# Patient Record
Sex: Female | Born: 1970 | Race: Black or African American | Hispanic: No | Marital: Married | Smoking: Current every day smoker
Health system: Southern US, Community
[De-identification: ages and names within clinical notes are randomized; demographics above are authoritative.]

## PROBLEM LIST (undated history)

## (undated) DIAGNOSIS — D869 Sarcoidosis, unspecified: Secondary | ICD-10-CM

## (undated) DIAGNOSIS — G43909 Migraine, unspecified, not intractable, without status migrainosus: Secondary | ICD-10-CM

## (undated) DIAGNOSIS — E079 Disorder of thyroid, unspecified: Secondary | ICD-10-CM

## (undated) DIAGNOSIS — I10 Essential (primary) hypertension: Secondary | ICD-10-CM

## (undated) HISTORY — DX: Essential (primary) hypertension: I10

## (undated) HISTORY — PX: LUNG BIOPSY: SHX232

## (undated) HISTORY — PX: TUBAL LIGATION: SHX77

---

## 2014-07-02 ENCOUNTER — Emergency Department (HOSPITAL_COMMUNITY)
Admission: EM | Admit: 2014-07-02 | Discharge: 2014-07-03 | Disposition: A | Discharge status: Home / Self Care | Payer: BC Managed Care – PPO | Attending: Emergency Medicine | Admitting: Emergency Medicine

## 2014-07-02 DIAGNOSIS — S0990XA Unspecified injury of head, initial encounter: Secondary | ICD-10-CM | POA: Insufficient documentation

## 2014-07-02 DIAGNOSIS — Y92410 Unspecified street and highway as the place of occurrence of the external cause: Secondary | ICD-10-CM | POA: Insufficient documentation

## 2014-07-02 DIAGNOSIS — S161XXA Strain of muscle, fascia and tendon at neck level, initial encounter: Secondary | ICD-10-CM

## 2014-07-02 DIAGNOSIS — Y9389 Activity, other specified: Secondary | ICD-10-CM | POA: Insufficient documentation

## 2014-07-02 DIAGNOSIS — S199XXA Unspecified injury of neck, initial encounter: Secondary | ICD-10-CM | POA: Diagnosis present

## 2014-07-02 DIAGNOSIS — M25571 Pain in right ankle and joints of right foot: Secondary | ICD-10-CM

## 2014-07-02 DIAGNOSIS — E079 Disorder of thyroid, unspecified: Secondary | ICD-10-CM | POA: Diagnosis not present

## 2014-07-02 DIAGNOSIS — S169XXA Unspecified injury of muscle, fascia and tendon at neck level, initial encounter: Secondary | ICD-10-CM | POA: Insufficient documentation

## 2014-07-02 DIAGNOSIS — Z79899 Other long term (current) drug therapy: Secondary | ICD-10-CM | POA: Insufficient documentation

## 2014-07-02 DIAGNOSIS — S99911A Unspecified injury of right ankle, initial encounter: Secondary | ICD-10-CM | POA: Diagnosis not present

## 2014-07-02 HISTORY — DX: Disorder of thyroid, unspecified: E07.9

## 2014-07-03 ENCOUNTER — Encounter (HOSPITAL_COMMUNITY): Payer: Self-pay | Admitting: Emergency Medicine

## 2014-07-03 ENCOUNTER — Emergency Department (HOSPITAL_COMMUNITY): Payer: BC Managed Care – PPO

## 2014-07-03 MED ORDER — HYDROCODONE-ACETAMINOPHEN 5-325 MG PO TABS: 1.0000 | ORAL_TABLET | Freq: Four times a day (QID) | ORAL | Status: DC | PRN

## 2014-07-03 MED ORDER — KETOROLAC TROMETHAMINE 30 MG/ML IJ SOLN
30.0000 mg | Freq: Once | INTRAMUSCULAR | Status: AC
  Administered 2014-07-03: 30 mg via INTRAMUSCULAR
  Filled 2014-07-03: qty 1

## 2014-07-03 MED ORDER — IBUPROFEN 600 MG PO TABS: 600.0000 mg | ORAL_TABLET | Freq: Four times a day (QID) | ORAL | Status: DC | PRN

## 2014-07-03 MED ORDER — OXYCODONE-ACETAMINOPHEN 5-325 MG PO TABS
1.0000 | ORAL_TABLET | Freq: Once | ORAL | Status: AC
  Administered 2014-07-03: 1 via ORAL
  Filled 2014-07-03: qty 1
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
## 2017-04-27 ENCOUNTER — Other Ambulatory Visit: Payer: Self-pay | Admitting: *Deleted

## 2017-04-27 DIAGNOSIS — N611 Abscess of the breast and nipple: Secondary | ICD-10-CM

## 2017-06-06 ENCOUNTER — Other Ambulatory Visit: Payer: Self-pay | Admitting: *Deleted

## 2017-06-06 DIAGNOSIS — N6310 Unspecified lump in the right breast, unspecified quadrant: Secondary | ICD-10-CM

## 2017-09-05 ENCOUNTER — Ambulatory Visit (HOSPITAL_COMMUNITY)
Admission: EM | Admit: 2017-09-05 | Discharge: 2017-09-05 | Disposition: A | Discharge status: Home / Self Care | Payer: BLUE CROSS/BLUE SHIELD | Attending: Family Medicine | Admitting: Family Medicine

## 2017-09-05 ENCOUNTER — Other Ambulatory Visit: Payer: Self-pay

## 2017-09-05 ENCOUNTER — Encounter (HOSPITAL_COMMUNITY): Payer: Self-pay | Admitting: Emergency Medicine

## 2017-09-05 DIAGNOSIS — L509 Urticaria, unspecified: Secondary | ICD-10-CM

## 2017-09-05 HISTORY — DX: Sarcoidosis, unspecified: D86.9

## 2017-09-05 MED ORDER — PREDNISONE 10 MG (21) PO TBPK: ORAL_TABLET | Freq: Every day | ORAL | 0 refills | Status: DC

## 2017-09-05 NOTE — ED Triage Notes (Signed)
Noticed rash on both legs and little fingers yesterday.  Reports rash itches.  Denies anyone else at home with rash

## 2017-09-06 NOTE — ED Provider Notes (Signed)
  [facility]   [id number] 09/05/17 Arrival Time: 1348  ASSESSMENT & PLAN:  1. Hives     Meds ordered this encounter  Medications  . predniSONE (STERAPRED UNI-PAK 21 TAB) 10 MG (21) TBPK tablet    Sig: Take by mouth daily. Take as directed.    Dispense:  21 tablet    Refill:  0   May use OTC Benadryl as well. Will f/u if not seeing improvement over the next 24-48 hours.  Reviewed expectations re: course of current medical issues. Questions answered. Outlined signs and symptoms indicating need for more acute intervention. Patient verbalized understanding. After Visit Summary given.   SUBJECTIVE:  Kristine Baldwin is a 47 y.o. female who presents with complaint of a "red rash" on her legs first noticed yesterday. Abrupt onset. Intense itching. OTC cream without relief. No h/o similar rash. No significant change today. Afebrile. No new exposures. No associated pain, nausea, vomiting, or breathing problems.  ROS: As per HPI.  OBJECTIVE: Vitals:   09/05/17 1435  BP: (!) 141/97  Pulse: 78  Resp: 18  Temp: 98.4 F (36.9 C)  TempSrc: Oral  SpO2: 100%    General appearance: alert; no distress Lungs: clear to auscultation bilaterally Heart: regular rate and rhythm Extremities: no edema Skin: warm and dry; wheals over lower legs Psychological: alert and cooperative; normal mood and affect   Allergies  Allergen Reactions  . Prednisone Other (See Comments)    Legs hurt    Past Medical History:  Diagnosis Date  . Sarcoidosis   . Thyroid disease    Social History   Socioeconomic History  . Marital status: Married    Spouse name: Not on file  . Number of children: Not on file  . Years of education: Not on file  . Highest education level: Not on file  Social Needs  . Financial resource strain: Not on file  . Food insecurity - worry: Not on file  . Food insecurity - inability: Not on file  . Transportation needs - medical: Not on file  . Transportation  needs - non-medical: Not on file  Occupational History  . Not on file  Tobacco Use  . Smoking status: Never Smoker  . Smokeless tobacco: Never Used  Substance and Sexual Activity  . Alcohol use: Yes  . Drug use: Yes    Types: Marijuana  . Sexual activity: Not on file  Other Topics Concern  . Not on file  Social History Narrative  . Not on file   Family History  Problem Relation Age of Onset  . Diabetes Mother   . Hypertension Mother   . Diabetes Father   . Hypertension Father    History reviewed. No pertinent surgical history.   Mardella LaymanHagler, Salimatou Simone, MD 09/09/17 1056

## 2017-09-08 DIAGNOSIS — I10 Essential (primary) hypertension: Secondary | ICD-10-CM | POA: Insufficient documentation

## 2017-12-26 ENCOUNTER — Emergency Department (HOSPITAL_COMMUNITY)
Admission: EM | Admit: 2017-12-26 | Discharge: 2017-12-26 | Disposition: A | Discharge status: Home / Self Care | Payer: BLUE CROSS/BLUE SHIELD | Attending: Emergency Medicine | Admitting: Emergency Medicine

## 2017-12-26 ENCOUNTER — Emergency Department (HOSPITAL_COMMUNITY): Payer: BLUE CROSS/BLUE SHIELD

## 2017-12-26 ENCOUNTER — Encounter (HOSPITAL_COMMUNITY): Payer: Self-pay | Admitting: Emergency Medicine

## 2017-12-26 DIAGNOSIS — R072 Precordial pain: Secondary | ICD-10-CM | POA: Insufficient documentation

## 2017-12-26 DIAGNOSIS — R918 Other nonspecific abnormal finding of lung field: Secondary | ICD-10-CM | POA: Diagnosis not present

## 2017-12-26 DIAGNOSIS — Z87891 Personal history of nicotine dependence: Secondary | ICD-10-CM | POA: Insufficient documentation

## 2017-12-26 DIAGNOSIS — R0789 Other chest pain: Secondary | ICD-10-CM | POA: Diagnosis present

## 2017-12-26 LAB — BASIC METABOLIC PANEL
Anion gap: 9 (ref 5–15)
BUN: 7 mg/dL (ref 6–20)
CO2: 26 mmol/L (ref 22–32)
CREATININE: 0.89 mg/dL (ref 0.44–1.00)
Calcium: 9.3 mg/dL (ref 8.9–10.3)
Chloride: 101 mmol/L (ref 101–111)
GFR calc non Af Amer: >60 mL/min (ref >60)
Glucose, Bld: 172 mg/dL — ABNORMAL HIGH (ref 65–99)
Potassium: 3.3 mmol/L — ABNORMAL LOW (ref 3.5–5.1)
Sodium: 136 mmol/L (ref 135–145)

## 2017-12-26 LAB — CBC
HCT: 41.4 % (ref 36.0–46.0)
Hemoglobin: 13.9 g/dL (ref 12.0–15.0)
MCH: 30.8 pg (ref 26.0–34.0)
MCHC: 33.6 g/dL (ref 30.0–36.0)
MCV: 91.8 fL (ref 78.0–100.0)
PLATELETS: 220 10*3/uL (ref 150–400)
RBC: 4.51 MIL/uL (ref 3.87–5.11)
RDW: 13.2 % (ref 11.5–15.5)
WBC: 7.7 10*3/uL (ref 4.0–10.5)

## 2017-12-26 LAB — I-STAT TROPONIN, ED: TROPONIN I, POC: 0 ng/mL (ref 0.00–0.08)

## 2017-12-26 LAB — I-STAT BETA HCG BLOOD, ED (MC, WL, AP ONLY): I-stat hCG, quantitative: 12.2 m[IU]/mL — ABNORMAL HIGH (ref <5)

## 2017-12-26 LAB — D-DIMER, QUANTITATIVE: D-Dimer, Quant: 0.73 ug/mL-FEU — ABNORMAL HIGH (ref 0.00–0.50)

## 2017-12-26 MED ORDER — KETOROLAC TROMETHAMINE 30 MG/ML IJ SOLN
30.0000 mg | Freq: Once | INTRAMUSCULAR | Status: AC
  Administered 2017-12-26: 30 mg via INTRAVENOUS
  Filled 2017-12-26: qty 1

## 2017-12-26 MED ORDER — IBUPROFEN 800 MG PO TABS: 800.0000 mg | ORAL_TABLET | Freq: Three times a day (TID) | ORAL | 0 refills | Status: DC | PRN

## 2017-12-26 MED ORDER — IOPAMIDOL (ISOVUE-370) INJECTION 76%
100.0000 mL | Freq: Once | INTRAVENOUS | Status: AC | PRN
  Administered 2017-12-26: 100 mL via INTRAVENOUS

## 2017-12-26 MED ORDER — IOPAMIDOL (ISOVUE-370) INJECTION 76%
INTRAVENOUS | Status: AC
  Filled 2017-12-26: qty 100

## 2017-12-26 NOTE — ED Provider Notes (Signed)
Emergency Department Provider Note   I have reviewed the triage vital signs and the nursing notes.   HISTORY  Chief Complaint Chest Pain   HPI Kristine Baldwin is a 47 y.o. female with PMH of sarcoidosis and tobacco use history presents to the emergency department for evaluation of left-sided chest pain for the past 4 days.  Her symptoms have been constant with intermittent worsening symptoms.  She states her pain is worse when she is using her left arm such as when she closes her car door.  Position makes no difference in symptoms.  No exertional or pleuritic component to pain.  She denies any shortness of breath.  No similar pain in the past.  She has had a tubal ligation and does not think it is possible that she is pregnant.  No productive cough or hemoptysis.  No fevers or chills. She has taken OTC medication for pain but none today.    Past Medical History:  Diagnosis Date  . Sarcoidosis   . Thyroid disease     There are no active problems to display for this patient.   History reviewed. No pertinent surgical history.  Current Outpatient Rx  . Order #: [id number] Class: Historical Med  . Order #: [id number] Class: Historical Med  . Order #: [id number] Class: Print  . Order #: [id number] Class: Normal    Allergies Prednisone  Family History  Problem Relation Age of Onset  . Diabetes Mother   . Hypertension Mother   . Diabetes Father   . Hypertension Father     Social History Social History   Tobacco Use  . Smoking status: Never Smoker  . Smokeless tobacco: Never Used  Substance Use Topics  . Alcohol use: Yes  . Drug use: Yes    Types: Marijuana    Review of Systems  Constitutional: No fever/chills Eyes: No visual changes. ENT: No sore throat. Cardiovascular: Positive chest pain. Respiratory: Denies shortness of breath. Gastrointestinal: No abdominal pain.  No nausea, no vomiting.  No diarrhea.  No constipation. Genitourinary: Negative for  dysuria. Musculoskeletal: Negative for back pain. Skin: Negative for rash. Neurological: Negative for headaches, focal weakness or numbness.  10-point ROS otherwise negative.  ____________________________________________   PHYSICAL EXAM:  VITAL SIGNS: ED Triage Vitals  Enc Vitals Group     BP 12/26/17 1215 (!) 142/95     Pulse Rate 12/26/17 1215 83     Resp 12/26/17 1215 16     Temp 12/26/17 1215 98.3 F (36.8 C)     Temp Source 12/26/17 1215 Oral     SpO2 12/26/17 1215 100 %     Weight 12/26/17 1215 94 lb (42.6 kg)     Height 12/26/17 1215 5\' 7"  (1.702 m)     Pain Score 12/26/17 1218 9   Constitutional: Alert and oriented. Well appearing and in no acute distress. Eyes: Conjunctivae are normal. Head: Atraumatic. Nose: No congestion/rhinnorhea. Mouth/Throat: Mucous membranes are moist.  Oropharynx non-erythematous. Neck: No stridor.   Cardiovascular: Normal rate, regular rhythm. Good peripheral circulation. Grossly normal heart sounds.   Respiratory: Normal respiratory effort.  No retractions. Lungs CTAB. Gastrointestinal: Soft and nontender. No distention.  Musculoskeletal: No lower extremity tenderness nor edema. No gross deformities of extremities. Patient winces and pulls away with palpation of the left chest wall. No crepitus or deformity.  Neurologic:  Normal speech and language. No gross focal neurologic deficits are appreciated.  Skin:  Skin is warm, dry and intact. No rash noted.  ____________________________________________  LABS (all labs ordered are listed, but only abnormal results are displayed)  Labs Reviewed  BASIC METABOLIC PANEL - Abnormal; Notable for the following components:      Result Value   Potassium 3.3 (*)    Glucose, Bld 172 (*)    All other components within normal limits  D-DIMER, QUANTITATIVE (NOT AT [facility]) - Abnormal; Notable for the following components:   D-Dimer, Quant 0.73 (*)    All other components within normal limits  I-STAT  BETA HCG BLOOD, ED ([facility], [facility], [facility] ONLY) - Abnormal; Notable for the following components:   I-stat hCG, quantitative 12.2 (*)    All other components within normal limits  CBC  I-STAT TROPONIN, ED   ____________________________________________  EKG   EKG Interpretation  Date/Time:  Tuesday December 26 2017 12:16:38 EDT Ventricular Rate:  85 PR Interval:  128 QRS Duration: 90 QT Interval:  392 QTC Calculation: 466 R Axis:   70 Text Interpretation:  Normal sinus rhythm Left ventricular hypertrophy Abnormal ECG No STEMI.  Confirmed by Alona Bene [phone]) on 12/26/2017 3:01:48 PM       ____________________________________________  RADIOLOGY  Dg Chest 2 View  Result Date: 12/26/2017 CLINICAL DATA:  Four day history of sharp chest pain, found have abscess normal EKG. History of sarcoidosis and thyroid disease. EXAM: CHEST - 2 VIEW COMPARISON:  None in PACs FINDINGS: The lungs are hyperinflated. There is hemidiaphragm flattening. There is no pneumothorax, pneumomediastinum, or pleural effusion. There are postsurgical changes in the lower left lung. The heart and pulmonary vascularity are normal. The bony thorax is unremarkable. The mediastinum is normal in width. IMPRESSION: Hyperinflation consistent with COPD or reactive airway disease. Postsurgical change at the left lung base. No acute cardiopulmonary abnormality. Electronically Signed   By: David  [place] M.D.   On: 12/26/2017 13:13   Ct Angio Chest Pe W And/or Wo Contrast  Result Date: 12/26/2017 CLINICAL DATA:  47 y/o F; 4 days of sharp piercing chest pain. History of sarcoidosis. EXAM: CT ANGIOGRAPHY CHEST WITH CONTRAST TECHNIQUE: Multidetector CT imaging of the chest was performed using the standard protocol during bolus administration of intravenous contrast. Multiplanar CT image reconstructions and MIPs were obtained to evaluate the vascular anatomy. CONTRAST:  ISOVUE-370 IOPAMIDOL (ISOVUE-370) INJECTION 76% COMPARISON:  None.  FINDINGS: Cardiovascular: Satisfactory opacification of the pulmonary arteries to the segmental level. No evidence of pulmonary embolism. Normal heart size. No pericardial effusion. Mediastinum/Nodes: No enlarged mediastinal, hilar, or axillary lymph nodes. Thyroid gland, trachea, and esophagus demonstrate no significant findings. Lungs/Pleura: In the right upper lobe there is a 7 x 7 mm nodule (7 mm mean diameter) on image 28 of series 7. In the right upper lobe there is a 5 x 9 mm nodule (7 mm mean diameter) on image 55 of series 7. No consolidation, effusion, pneumothorax. Surgical scarring at the left lung base. Upper Abdomen: 14 mm left lobe of liver cyst. Musculoskeletal: No chest wall abnormality. No acute or significant osseous findings. Review of the MIP images confirms the above findings. IMPRESSION: 1. No pulmonary embolus or acute pulmonary process. 2. 7 mm mean diameter pulmonary nodules in the right upper lobe probably related to history of sarcoidosis. Non-contrast chest CT at 3-6 months is recommended. If the nodules are stable at time of repeat CT, then future CT at 18-24 months (from today's scan) is considered optional for low-risk patients, but is recommended for high-risk patients. This recommendation follows the consensus statement: Guidelines for Management of Incidental Pulmonary Nodules  Detected on CT Images: From the Fleischner Society 2017; Radiology 2017; [id number]:[id number]. Electronically Signed   By: Mitzi HansenLance  Furusawa-Stratton M.D.   On: 12/26/2017 20:23    ____________________________________________   PROCEDURES  Procedure(s) performed:   Procedures  None ____________________________________________   INITIAL IMPRESSION / ASSESSMENT AND PLAN / ED COURSE  Pertinent labs & imaging results that were available during my care of the patient were reviewed by me and considered in my medical decision making (see chart for details).  Patient presents to the emergency department for  evaluation of chest pain.  The patient's chest pain seems to be mostly musculoskeletal in etiology.  She has had mostly constant symptoms for the past 4 days.  EKG and troponin were reviewed and negative.  She was initially evaluated at urgent care and then referred to the emergency department.  Lower suspicion for pulmonary embolism but some of her pain symptoms are slightly concerning such as the sharp quality of intermittent pain with baseline soreness.  Patient is otherwise low risk for PE.  Plan for d-dimer for further risk stratification.  No need for troponin trending with 4 days of constant pain and nonspecific EKG.   Differential includes all life-threatening causes for chest pain. This includes but is not exclusive to acute coronary syndrome, aortic dissection, pulmonary embolism, cardiac tamponade, community-acquired pneumonia, pericarditis, musculoskeletal chest wall pain, etc.  06:20 PM Patient with elevated D-dimer. Discussed results with the patient and will move ahead with CTA to r/o PE given CP. Patient feeling better with Toradol.   CTA negative for PE. Did discuss the pulmonary nodules and need for repeat CT chest in 3-6 months. CP improved with Toradol.   At this time, I do not feel there is any life-threatening condition present. I have reviewed and discussed all results (EKG, imaging, lab, urine as appropriate), exam findings with patient. I have reviewed nursing notes and appropriate previous records.  I feel the patient is safe to be discharged home without further emergent workup. Discussed usual and customary return precautions. Patient and family (if present) verbalize understanding and are comfortable with this plan.  Patient will follow-up with their primary care provider. If they do not have a primary care provider, information for follow-up has been provided to them. All questions have been answered.  ____________________________________________  FINAL CLINICAL  IMPRESSION(S) / ED DIAGNOSES  Final diagnoses:  Precordial chest pain  Pulmonary nodules     MEDICATIONS GIVEN DURING THIS VISIT:  Medications  iopamidol (ISOVUE-370) 76 % injection (has no administration in time range)  ketorolac (TORADOL) 30 MG/ML injection 30 mg (30 mg Intravenous Given 12/26/17 1639)  iopamidol (ISOVUE-370) 76 % injection 100 mL (100 mLs Intravenous Contrast Given 12/26/17 1952)     NEW OUTPATIENT MEDICATIONS STARTED DURING THIS VISIT:  Discharge Medication List as of 12/26/2017  8:45 PM    START taking these medications   Details  ibuprofen (ADVIL,MOTRIN) 800 MG tablet Take 1 tablet (800 mg total) by mouth every 8 (eight) hours as needed., Starting Tue 12/26/2017, Print        Note:  This document was prepared using Dragon voice recognition software and may include unintentional dictation errors.  Alona BeneJoshua Shaquil Aldana, MD Emergency Medicine    Carlas Vandyne, Arlyss RepressJoshua G, MD 12/26/17 [phone]

## 2017-12-26 NOTE — ED Notes (Signed)
Patient transported to CT 

## 2017-12-26 NOTE — Discharge Instructions (Signed)
You have been seen in the Emergency Department (ED) today for chest pain.  As we have discussed today?s test results are normal, but you may require further testing.  You do have some lung nodules that were found on CT. The radiologist recommends follow up CT scan of the chest in 3-6 months. Talk with your PCP about scheduling this testing.   Please follow up with the recommended doctor as instructed above in these documents regarding today?s emergent visit and your recent symptoms to discuss further management.  Continue to take your regular medications.   Return to the Emergency Department (ED) if you experience any further chest pain/pressure/tightness, difficulty breathing, or sudden sweating, or other symptoms that concern you.   Chest Pain (Nonspecific) It is often hard to give a specific diagnosis for the cause of chest pain. There is always a chance that your pain could be related to something serious, such as a heart attack or a blood clot in the lungs. You need to follow up with your health care provider for further evaluation. CAUSES  Heartburn. Pneumonia or bronchitis. Anxiety or stress. Inflammation around your heart (pericarditis) or lung (pleuritis or pleurisy). A blood clot in the lung. A collapsed lung (pneumothorax). It can develop suddenly on its own (spontaneous pneumothorax) or from trauma to the chest. Shingles infection (herpes zoster virus). The chest wall is composed of bones, muscles, and cartilage. Any of these can be the source of the pain. The bones can be bruised by injury. The muscles or cartilage can be strained by coughing or overwork. The cartilage can be affected by inflammation and become sore (costochondritis). DIAGNOSIS  Lab tests or other studies may be needed to find the cause of your pain. Your health care provider may have you take a test called an ambulatory electrocardiogram (ECG). An ECG records your heartbeat patterns over a 24-hour period. You  may also have other tests, such as: Transthoracic echocardiogram (TTE). During echocardiography, sound waves are used to evaluate how blood flows through your heart. Transesophageal echocardiogram (TEE). Cardiac monitoring. This allows your health care provider to monitor your heart rate and rhythm in real time. Holter monitor. This is a portable device that records your heartbeat and can help diagnose heart arrhythmias. It allows your health care provider to track your heart activity for several days, if needed. Stress tests by exercise or by giving medicine that makes the heart beat faster. TREATMENT  Treatment depends on what may be causing your chest pain. Treatment may include: Acid blockers for heartburn. Anti-inflammatory medicine. Pain medicine for inflammatory conditions. Antibiotics if an infection is present. You may be advised to change lifestyle habits. This includes stopping smoking and avoiding alcohol, caffeine, and chocolate. You may be advised to keep your head raised (elevated) when sleeping. This reduces the chance of acid going backward from your stomach into your esophagus. Most of the time, nonspecific chest pain will improve within 2-3 days with rest and mild pain medicine.  HOME CARE INSTRUCTIONS  If antibiotics were prescribed, take them as directed. Finish them even if you start to feel better. For the next few days, avoid physical activities that bring on chest pain. Continue physical activities as directed. Do not use any tobacco products, including cigarettes, chewing tobacco, or electronic cigarettes. Avoid drinking alcohol. Only take medicine as directed by your health care provider. Follow your health care provider's suggestions for further testing if your chest pain does not go away. Keep any follow-up appointments you made. If you  do not go to an appointment, you could develop lasting (chronic) problems with pain. If there is any problem keeping an  appointment, call to reschedule. SEEK MEDICAL CARE IF:  Your chest pain does not go away, even after treatment. You have a rash with blisters on your chest. You have a fever. SEEK IMMEDIATE MEDICAL CARE IF:  You have increased chest pain or pain that spreads to your arm, neck, jaw, back, or abdomen. You have shortness of breath. You have an increasing cough, or you cough up blood. You have severe back or abdominal pain. You feel nauseous or vomit. You have severe weakness. You faint. You have chills. This is an emergency. Do not wait to see if the pain will go away. Get medical help at once. Call your local emergency services (911 in [place] Do not drive yourself to the hospital. MAKE SURE YOU:  Understand these instructions. Will watch your condition. Will get help right away if you are not doing well or get worse. Document Released: 06/01/2005 Document Revised: 08/27/2013 Document Reviewed: 03/27/2008 [facility] Patient Information 2015 [place]. This information is not intended to replace advice given to you by your health care provider. Make sure you discuss any questions you have with your health care provider.

## 2017-12-26 NOTE — ED Triage Notes (Signed)
Pt to ER for evaluation of 4 days sharp "piercing" chest pain. [facility] Pt in NAD. Denies all other symptoms.

## 2018-09-24 ENCOUNTER — Other Ambulatory Visit: Payer: Self-pay

## 2018-09-24 ENCOUNTER — Emergency Department (HOSPITAL_COMMUNITY): Payer: BLUE CROSS/BLUE SHIELD

## 2018-09-24 ENCOUNTER — Emergency Department (HOSPITAL_COMMUNITY)
Admission: EM | Admit: 2018-09-24 | Discharge: 2018-09-25 | Disposition: A | Discharge status: Home / Self Care | Payer: BLUE CROSS/BLUE SHIELD | Attending: Emergency Medicine | Admitting: Emergency Medicine

## 2018-09-24 DIAGNOSIS — N3000 Acute cystitis without hematuria: Secondary | ICD-10-CM | POA: Insufficient documentation

## 2018-09-24 DIAGNOSIS — R1031 Right lower quadrant pain: Secondary | ICD-10-CM | POA: Diagnosis present

## 2018-09-24 DIAGNOSIS — R1030 Lower abdominal pain, unspecified: Secondary | ICD-10-CM

## 2018-09-24 DIAGNOSIS — N3090 Cystitis, unspecified without hematuria: Secondary | ICD-10-CM

## 2018-09-24 LAB — COMPREHENSIVE METABOLIC PANEL
ALT: 13 U/L (ref 0–44)
ANION GAP: 13 (ref 5–15)
AST: 27 U/L (ref 15–41)
Albumin: 4.5 g/dL (ref 3.5–5.0)
Alkaline Phosphatase: 70 U/L (ref 38–126)
BUN: 10 mg/dL (ref 6–20)
CO2: 24 mmol/L (ref 22–32)
Calcium: 9.5 mg/dL (ref 8.9–10.3)
Chloride: 99 mmol/L (ref 98–111)
Creatinine, Ser: 0.94 mg/dL (ref 0.44–1.00)
GFR calc Af Amer: >60 mL/min (ref >60)
GFR calc non Af Amer: >60 mL/min (ref >60)
Glucose, Bld: 153 mg/dL — ABNORMAL HIGH (ref 70–99)
Potassium: 3.1 mmol/L — ABNORMAL LOW (ref 3.5–5.1)
SODIUM: 136 mmol/L (ref 135–145)
Total Bilirubin: 1.2 mg/dL (ref 0.3–1.2)
Total Protein: 7.7 g/dL (ref 6.5–8.1)

## 2018-09-24 LAB — CBC
HCT: 39.5 % (ref 36.0–46.0)
HEMOGLOBIN: 13.1 g/dL (ref 12.0–15.0)
MCH: 31.8 pg (ref 26.0–34.0)
MCHC: 33.2 g/dL (ref 30.0–36.0)
MCV: 95.9 fL (ref 80.0–100.0)
Platelets: 224 10*3/uL (ref 150–400)
RBC: 4.12 MIL/uL (ref 3.87–5.11)
RDW: 13.9 % (ref 11.5–15.5)
WBC: 8 10*3/uL (ref 4.0–10.5)
nRBC: 0 % (ref 0.0–0.2)

## 2018-09-24 LAB — URINALYSIS, ROUTINE W REFLEX MICROSCOPIC
Glucose, UA: NEGATIVE mg/dL
Hgb urine dipstick: NEGATIVE
Ketones, ur: 20 mg/dL — AB
Nitrite: NEGATIVE
Protein, ur: 100 mg/dL — AB
Specific Gravity, Urine: 1.028 (ref 1.005–1.030)
pH: 5 (ref 5.0–8.0)

## 2018-09-24 LAB — POC URINE PREG, ED: Preg Test, Ur: NEGATIVE

## 2018-09-24 LAB — LIPASE, BLOOD: LIPASE: 26 U/L (ref 11–51)

## 2018-09-24 MED ORDER — IOHEXOL 300 MG/ML  SOLN
100.0000 mL | Freq: Once | INTRAMUSCULAR | Status: AC | PRN
  Administered 2018-09-24: 100 mL via INTRAVENOUS

## 2018-09-24 NOTE — ED Notes (Signed)
ED Provider at bedside. 

## 2018-09-24 NOTE — ED Notes (Signed)
Patient transported to CT 

## 2018-09-24 NOTE — ED Provider Notes (Signed)
[facility] EMERGENCY DEPARTMENT Provider Note   CSN: [id number] Arrival date & time: 09/24/18  1943   History   Chief Complaint Chief Complaint  Patient presents with  . Abdominal Pain    HPI Kristine Baldwin is a 48 y.o. female.  HPI   Kristine Baldwin is a 48 y.o. female with PMH of sarcoidosis and thyroid disease who presents with right lower quadrant pain/suprapubic pain for the last week that was intermittent and described as a crampy sensation but has been constant since around 11 AM this morning.  No fevers or chills but did have one episode of nonbloody and bilious emesis yesterday.  Able to eat and drink earlier today.  No recent falls or trauma.  Adamantly denies vaginal discharge or burning/itching.  No recent medication changes.  No dysuria or hematuria.  She does report the pain in her suprapubic space intermittently radiates toward her back on both sides.  Past Medical History:  Diagnosis Date  . Sarcoidosis   . Thyroid disease     There are no active problems to display for this patient.   No past surgical history on file.   OB History   No obstetric history on file.      Home Medications    Prior to Admission medications   Medication Sig Start Date End Date Taking? Authorizing Provider  ibuprofen (ADVIL,MOTRIN) 200 MG tablet Take 600 mg by mouth every 6 (six) hours as needed (for pain or headaches).   Yes [provider]  cephALEXin (KEFLEX) 500 MG capsule Take 1 capsule (500 mg total) by mouth 4 (four) times daily for 10 days. 09/25/18 10/05/18  Asal Teas, Sherryle Lis, MD  ibuprofen (ADVIL,MOTRIN) 800 MG tablet Take 1 tablet (800 mg total) by mouth every 8 (eight) hours as needed. Patient not taking: Reported on 09/24/2018 12/26/17   Long, Arlyss Repress, MD  predniSONE (STERAPRED UNI-PAK 21 TAB) 10 MG (21) TBPK tablet Take by mouth daily. Take as directed. Patient not taking: Reported on 09/24/2018 09/05/17   Mardella Layman, MD    Family  History Family History  Problem Relation Age of Onset  . Diabetes Mother   . Hypertension Mother   . Diabetes Father   . Hypertension Father     Social History Social History   Tobacco Use  . Smoking status: Never Smoker  . Smokeless tobacco: Never Used  Substance Use Topics  . Alcohol use: Yes  . Drug use: Yes    Types: Marijuana     Allergies   Patient has no known allergies.   Review of Systems Review of Systems  Constitutional: Negative for chills and fever.  HENT: Negative for ear pain and sore throat.   Eyes: Negative for pain and visual disturbance.  Respiratory: Negative for cough and shortness of breath.   Cardiovascular: Negative for chest pain and palpitations.  Gastrointestinal: Positive for abdominal pain, nausea and vomiting.  Genitourinary: Negative for dysuria and hematuria.  Musculoskeletal: Negative for arthralgias and back pain.  Skin: Negative for color change and rash.  Neurological: Negative for seizures and syncope.  All other systems reviewed and are negative.    Physical Exam Updated Vital Signs BP (!) 158/88 (BP Location: Right Arm)   Pulse 66   Temp 98.1 F (36.7 C) (Oral)   Resp 16   Ht 5\' 7"  (1.702 m)   Wt 45.8 kg   LMP 09/07/2017 (Approximate)   SpO2 100%   BMI 15.82 kg/m  Physical Exam Vitals signs and nursing note reviewed.  Constitutional:      General: She is not in acute distress.    Appearance: Normal appearance. She is well-developed. She is not diaphoretic.  HENT:     Head: Normocephalic and atraumatic.  Eyes:     Conjunctiva/sclera: Conjunctivae normal.  Neck:     Musculoskeletal: Neck supple.  Cardiovascular:     Rate and Rhythm: Normal rate and regular rhythm.     Heart sounds: No murmur.  Pulmonary:     Effort: Pulmonary effort is normal. No respiratory distress.     Breath sounds: Normal breath sounds.  Abdominal:     General: Abdomen is flat.     Palpations: Abdomen is soft.     Tenderness: There  is abdominal tenderness in the right lower quadrant and suprapubic area. There is no right CVA tenderness, left CVA tenderness or guarding. Positive signs include McBurney's sign and obturator sign. Negative signs include Murphy's sign, Rovsing's sign and psoas sign.     Hernia: No hernia is present.  Skin:    General: Skin is warm and dry.  Neurological:     General: No focal deficit present.     Mental Status: She is alert and oriented to person, place, and time.  Psychiatric:        Behavior: Behavior is cooperative.      ED Treatments / Results  Labs (all labs ordered are listed, but only abnormal results are displayed) Labs Reviewed  COMPREHENSIVE METABOLIC PANEL - Abnormal; Notable for the following components:      Result Value   Potassium 3.1 (*)    Glucose, Bld 153 (*)    All other components within normal limits  URINALYSIS, ROUTINE W REFLEX MICROSCOPIC - Abnormal; Notable for the following components:   Color, Urine AMBER (*)    APPearance HAZY (*)    Bilirubin Urine SMALL (*)    Ketones, ur 20 (*)    Protein, ur 100 (*)    Leukocytes, UA MODERATE (*)    Bacteria, UA MANY (*)    All other components within normal limits  URINE CULTURE  LIPASE, BLOOD  CBC  POC URINE PREG, ED    EKG None  Radiology Ct Abdomen Pelvis W Contrast  Result Date: 09/24/2018 CLINICAL DATA:  48 year old female with right lower quadrant abdominal pain. EXAM: CT ABDOMEN AND PELVIS WITH CONTRAST TECHNIQUE: Multidetector CT imaging of the abdomen and pelvis was performed using the standard protocol following bolus administration of intravenous contrast. CONTRAST:  OMNIPAQUE IOHEXOL 300 MG/ML  SOLN COMPARISON:  Chest CT dated 12/26/2017 FINDINGS: Lower chest: An area of linear scarring with dystrophic calcification noted at the left lung base similar to prior CT. The visualized lung bases are otherwise clear. Evaluation of the lung bases are however limited due to respiratory motion  artifact. There is no intra-abdominal free air. There is trace free fluid within the pelvis. Hepatobiliary: Stable 15 mm cyst in the left lobe of the liver. Additional subcentimeter hypodense lesions throughout the liver are too small to characterize. No intrahepatic biliary ductal dilatation. The gallbladder is unremarkable. Pancreas: Unremarkable. No pancreatic ductal dilatation or surrounding inflammatory changes. Spleen: Normal in size without focal abnormality. Adrenals/Urinary Tract: Adrenal glands are unremarkable. Kidneys are normal, without renal calculi, focal lesion, or hydronephrosis. Bladder is unremarkable. Stomach/Bowel: There is no bowel obstruction. The appendix is not identified with certainty and therefore not evaluated. CT of the pelvis with oral contrast and delayed images  and opacification of the cecum may provide better evaluation of the appendix. A linear and serpiginous high attenuating structure in the right posterior hemipelvis (coronal series 6 images 41-50) represents a vessel. Vascular/Lymphatic: Mild atherosclerotic calcification of the abdominal aorta. The IVC is unremarkable. No portal venous gas. There is no adenopathy. Reproductive: Heterogeneous uterus with probable small fibroids. The ovaries are unremarkable as visualized. Other: None Musculoskeletal: No acute or significant osseous findings. IMPRESSION: Nonvisualization of the appendix. CT of the pelvis with oral contrast after opacification of the cecum may provide better evaluation. No bowel obstruction. Electronically Signed   By: Elgie Collard M.D.   On: 09/24/2018 23:34    Procedures Procedures (including critical care time)  Medications Ordered in ED Medications  iohexol (OMNIPAQUE) 300 MG/ML solution 100 mL (100 mLs Intravenous Contrast Given 09/24/18 2304)  cephALEXin (KEFLEX) capsule 500 mg (500 mg Oral Given 09/25/18 0033)     Initial Impression / Assessment and Plan / ED Course  I have reviewed the  triage vital signs and the nursing notes.  Pertinent labs & imaging results that were available during my care of the patient were reviewed by me and considered in my medical decision making (see chart for details).      MDM:  Imaging: CT abdomen pelvis with contrast shows nonvisualization of the appendix.  CT of the pelvis with oral contrast after opacification of the cecum may provide better evaluation.  No bowel obstruction.  ED Provider Interpretation of EKG: None indicated at this time  Labs: CBC with moderate leukocytes, many bacteria, 20 ketones, amber appearance, CBC normal, CMP with a potassium of 3.1 otherwise unremarkable, lipase 26 and pregnancy test negative.  On initial evaluation, patient appears stable. Afebrile and hemodynamically stable. Alert and oriented x4, pleasant, and cooperative.  Patient presents with abdominal pain as detailed above.  On exam, patient has tenderness in the right lower quadrant and over the suprapubic space.  No CVA tenderness.  Pain McBurney's point without a Rovsing but does have positive obturator sign.  No psoas sign.  No toe tap pain.  No fever or chills but did have episode of nonbloody bilious emesis yesterday.  Urinalysis ordered prior to my evaluation is concerning for infection, however, is without nitrite.  Concern for possible sterile pyuria given her abdominal exam although she does have many bacteria present making this less likely.  Decision made to perform CT abdomen pelvis with contrast with concern for possible underlying appendicitis and this showed nonvisualization of the appendix.  On reevaluation she had tenderness that was more focused over her bladder.  She has no CVA tenderness on exam but does report pain that radiates toward her kidneys bilaterally.  Given her emesis yesterday with these findings we will treat her empirically for pyelonephritis.  Discussed possible additional CT scans as well as other med imaging modalities with  radiology who felt that her imaging was reassuring.  No inflammatory changes in the right lower quadrant although appendix not visualized.  Also considered GYN pathology such as PID or TOA although no evidence at this time.  She adamantly denied vaginal discharge and bleeding as well as itching and burning.  No recent trauma.  Pregnancy negative.  Attending physician spoke to the patient length at the bedside about pelvic exam and patient declined.  She was comfortable with the treatment plan of antibiotics and close follow-up with strict return precautions if her symptoms worsen and she verbalized understanding.  Prescription for 10-day course of Keflex for possible  pyelonephritis and urine culture was ordered.  Discharged in stable condition.  The plan for this patient was discussed with Dr. Lockie Molauratolo who voiced agreement and who oversaw evaluation and treatment of this patient.   The patient was fully informed and involved with the history taking, evaluation, workup including labs/images, and plan. The patient's concerns and questions were addressed to the patient's satisfaction and she expressed agreement with the plan to DC home.    Final Clinical Impressions(s) / ED Diagnoses   Final diagnoses:  Lower abdominal pain  Cystitis    ED Discharge Orders         Ordered    cephALEXin (KEFLEX) 500 MG capsule  4 times daily     09/25/18 0019           Suzzette Gasparro, Sherryle LisJames F II, MD 09/25/18 0035    Virgina Norfolkuratolo, Adam, DO 09/25/18 1710

## 2018-09-24 NOTE — ED Triage Notes (Signed)
Patient c/o RLQ abd pain with nausea that began today at 11:00

## 2018-09-25 MED ORDER — CEPHALEXIN 250 MG PO CAPS
500.0000 mg | ORAL_CAPSULE | Freq: Once | ORAL | Status: AC
  Administered 2018-09-25: 500 mg via ORAL
  Filled 2018-09-25: qty 2

## 2018-09-25 MED ORDER — CEPHALEXIN 500 MG PO CAPS: 500.0000 mg | ORAL_CAPSULE | Freq: Four times a day (QID) | ORAL | 0 refills | Status: AC

## 2018-09-25 NOTE — ED Provider Notes (Signed)
I have personally seen and examined the patient. I have reviewed the documentation on PMH/FH/Soc Hx. I have discussed the plan of care with the resident and patient.  I have reviewed and agree with the resident's documentation. Please see associated encounter note.  Briefly, the patient is a 48 y.o. female here with abdominal pain.  Patient with history of sarcoid.  Patient with normal vitals.  No fever.  Patient with lower abdominal pain for the last several hours.  Denies any fever, chills.  No nausea, no vomiting.  Denies any vaginal discharge.  No urinary symptoms.  Patient mildly tender throughout lower abdomen.  Mostly tender in the suprapubic region and right lower quadrant on exam.  Denies any history of kidney stones.  Patient with lab work done prior to my evaluation.  No significant leukocytosis, anemia, electrolyte abnormality, kidney injury.  Patient with negative pregnancy test.  Urinalysis with moderate leukocytes, many bacteria.  Suspicious for urinary tract infection.  CT scan showed no acute findings.  However CT scan did not show appendix definitively.  Talk to radiology on the phone and states that we could consider doing a repeat scan with oral contrast to show better pictures.  At this time radiology states there is no ovarian cyst or other significant pathology.  There is no stranding around what would be the appendix but cant r/o appy due to not being able to see it, patient denies prior appy.  Patient was reevaluated and continues to have tenderness mostly in the suprapubic right lower quadrant on exam.  She has no flank tenderness.  Patient overall is well-appearing.  She denies any vaginal discharge or bleeding.  She does not want pelvic exam at this time.  She is not concerned about any STDs. Patient with UTI on labs and likely cause of discomfort. Discussed multiple options including repeat CT scan with oral contrast. However, after long discussion we will treat urinary tract infection  and patient was given strict return precautions.  This may be possibly an early appendicitis.  If she does not improve she would likely need a pelvic exam and repeat CT.  She states she is comfortable with antibiotics and plan for return to ED if worsening symptoms including fever, n/v, worsening pain.  She does not have a primary care doctor and therefore will return to the ED if her symptoms worsen. Urine culture was sent.  Patient understands return precautions and was discharged from ED in good condition.   EKG Interpretation None         Virgina Norfolk, DO 09/25/18 0124

## 2018-09-26 LAB — URINE CULTURE

## 2018-10-18 IMAGING — CT CT ANGIO CHEST
2 of 6 series · 18 of 36 positions shown · IV contrast (Omni 300)
Comparison: None.

CLINICAL DATA: 46 y/o F; 4 days of sharp piercing chest pain.
History of sarcoidosis.

EXAM:
CT ANGIOGRAPHY CHEST WITH CONTRAST
TECHNIQUE: Multidetector CT imaging of the chest was performed using the
standard protocol during bolus administration of intravenous
contrast. Multiplanar CT image reconstructions and MIPs were
obtained to evaluate the vascular anatomy.
CONTRAST:  100mL [id number] IOPAMIDOL ([id number]) INJECTION 76%

[Series 8: pe thins · axial · 0.61mm/px · z∈[-433,-165]mm · 17 of 302 slices shown]
[im 17/302  lung]
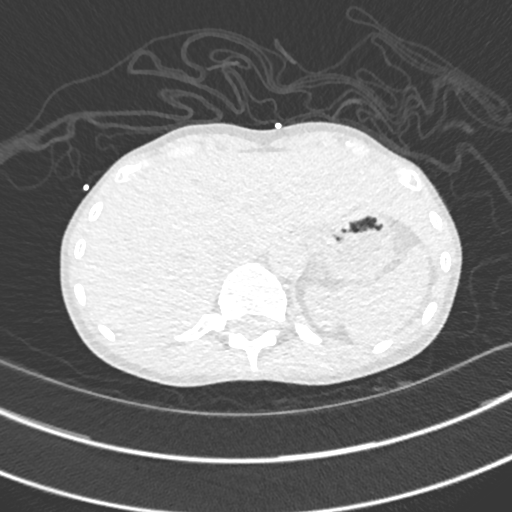
[im 34/302  mediastinal]
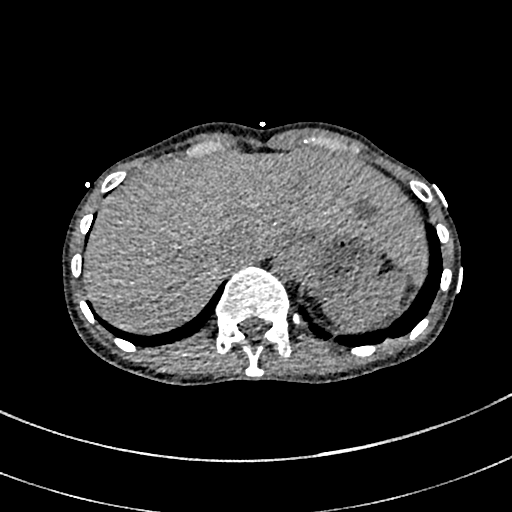
[im 51/302  lung]
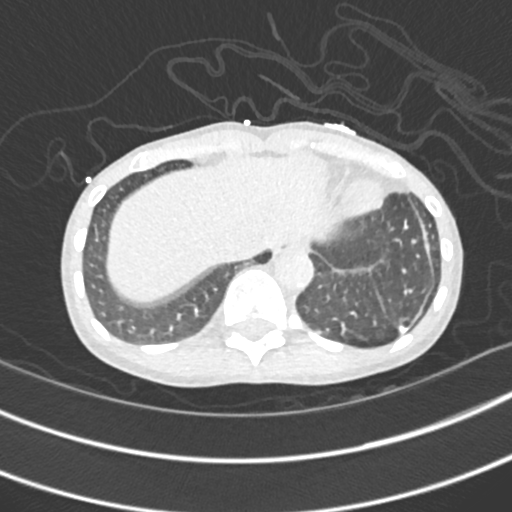
[im 67/302  mediastinal]
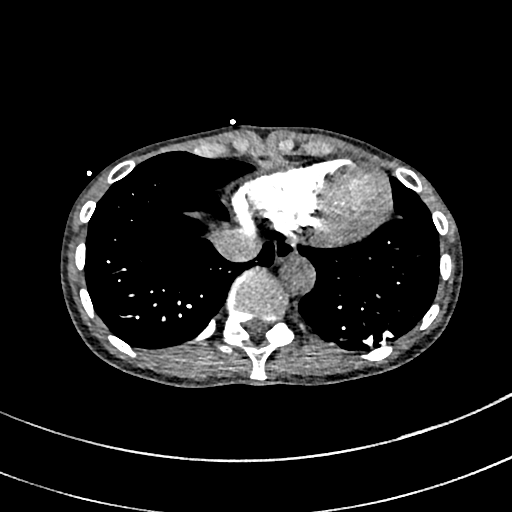
[im 84/302  lung]
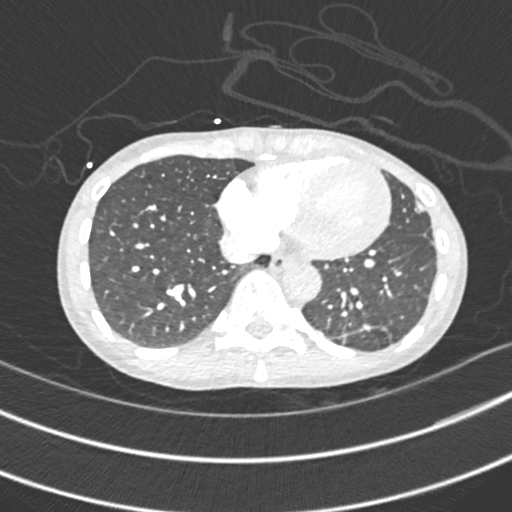
[im 101/302  mediastinal]
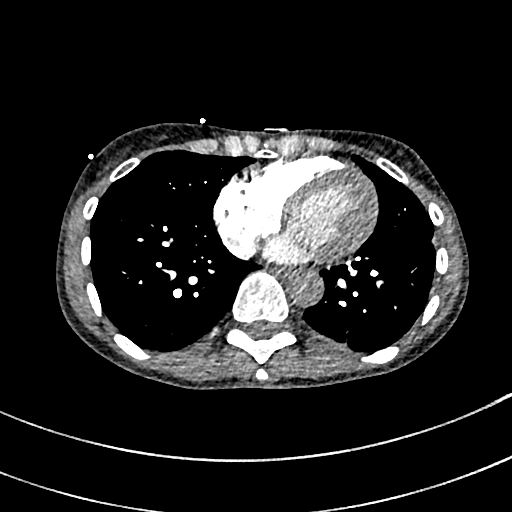
[im 118/302  lung]
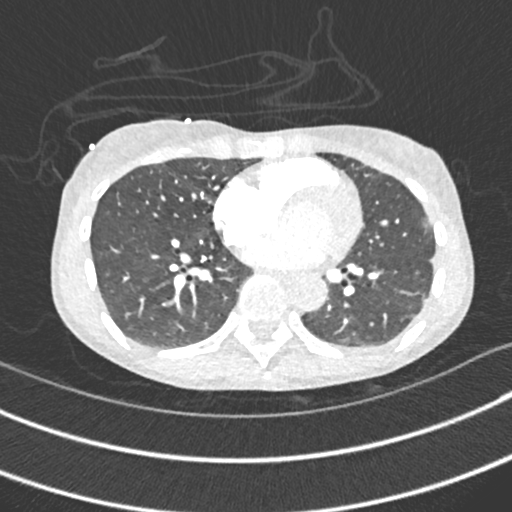
[im 134/302  mediastinal]
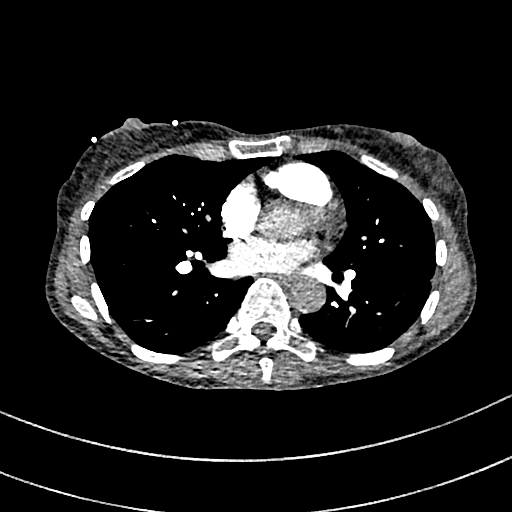
[im 151/302  lung]
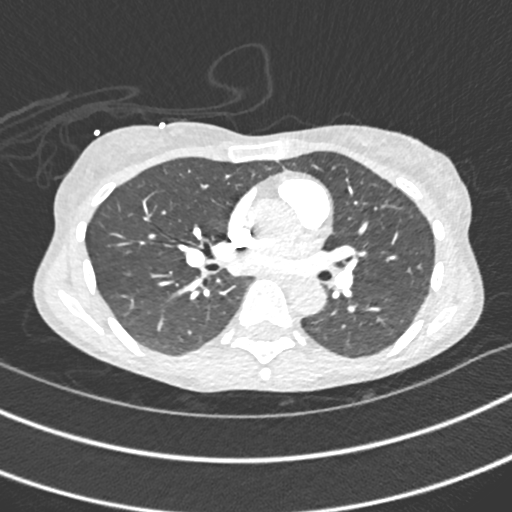
[im 168/302  mediastinal]
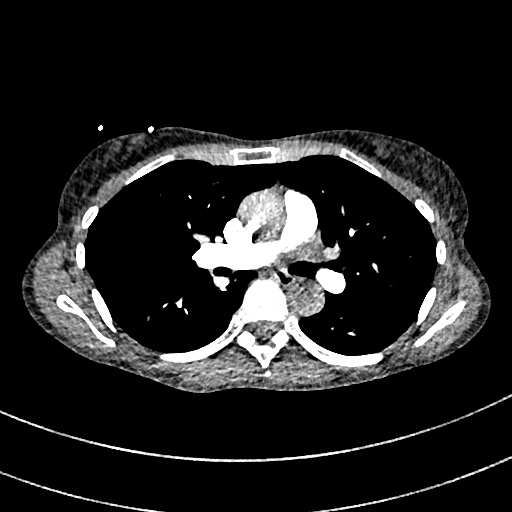
[im 184/302  lung]
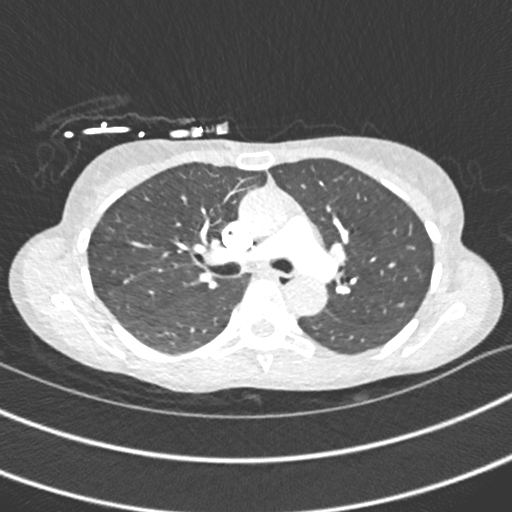
[im 201/302  mediastinal]
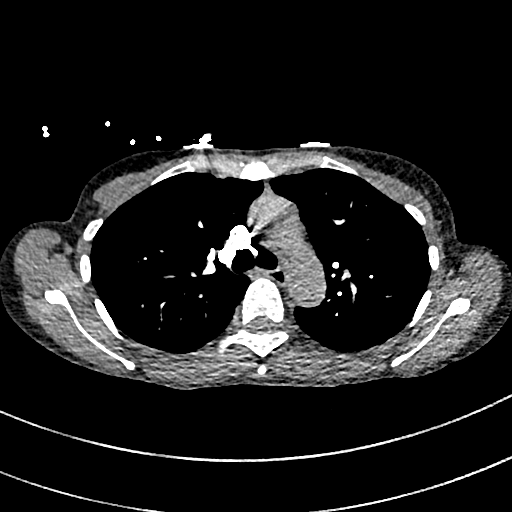
[im 218/302  lung]
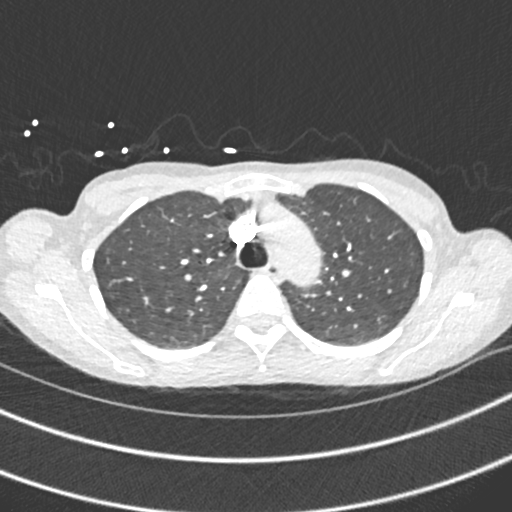
[im 235/302  mediastinal]
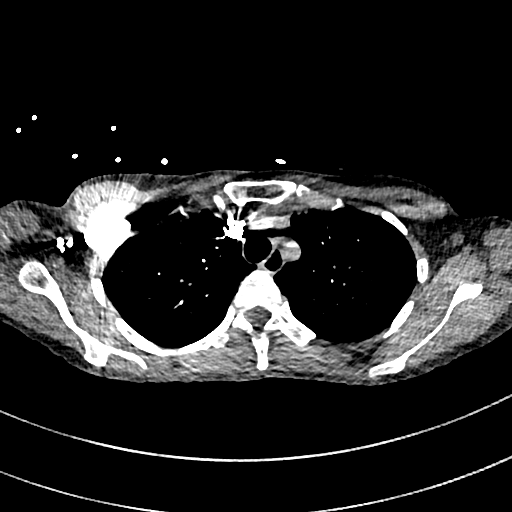
[im 251/302  lung]
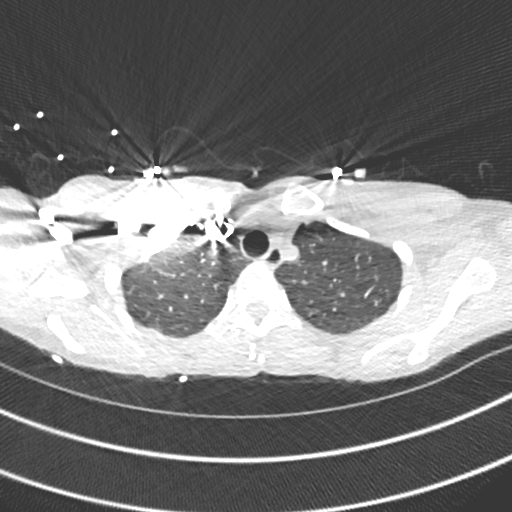
[im 268/302  mediastinal]
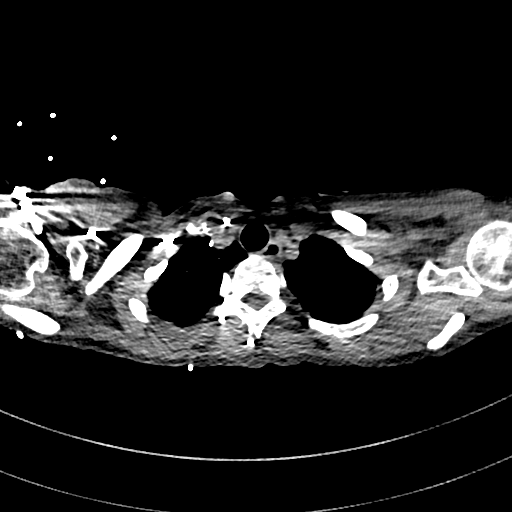
[im 285/302  lung]
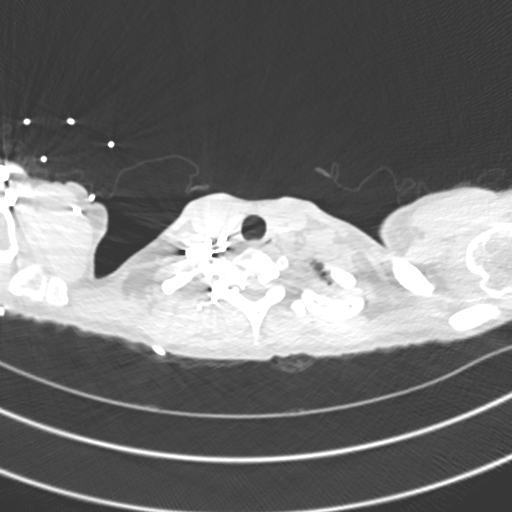

[Series 9: pe 2mm cor · coronal · 0.59mm/px · 1 of 95 slices shown]
[im 48/95  mediastinal]
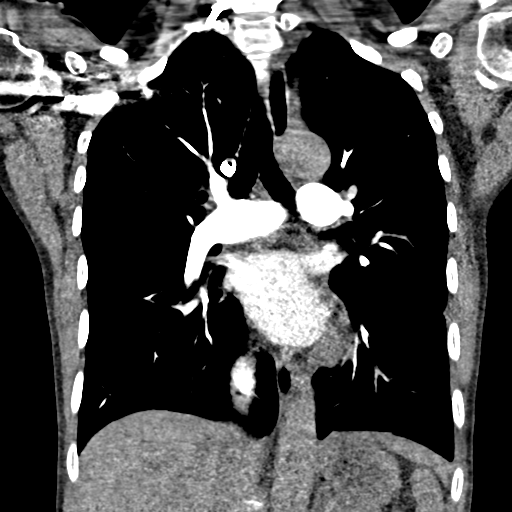

[18 of 36 positions shown; findings below may reference images not displayed]

FINDINGS: Cardiovascular: Satisfactory opacification of the pulmonary arteries
to the segmental level. No evidence of pulmonary embolism. Normal
heart size. No pericardial effusion.

Mediastinum/Nodes: No enlarged mediastinal, hilar, or axillary lymph
nodes. Thyroid gland, trachea, and esophagus demonstrate no
significant findings.

Lungs/Pleura: In the right upper lobe there is a 7 x 7 mm nodule (7
mm mean diameter) on image 28 of series 7. In the right upper lobe
there is a 5 x 9 mm nodule (7 mm mean diameter) on image 55 of
series 7. No consolidation, effusion, pneumothorax. Surgical
scarring at the left lung base.

Upper Abdomen: 14 mm left lobe of liver cyst.

Musculoskeletal: No chest wall abnormality. No acute or significant
osseous findings.

Review of the MIP images confirms the above findings.
IMPRESSION: 1. No pulmonary embolus or acute pulmonary process.
2. 7 mm mean diameter pulmonary nodules in the right upper lobe
probably related to history of sarcoidosis. Non-contrast chest CT at
3-6 months is recommended. If the nodules are stable at time of
repeat CT, then future CT at 18-24 months (from today's scan) is
considered optional for low-risk patients, but is recommended for
high-risk patients. This recommendation follows the consensus
statement: Guidelines for Management of Incidental Pulmonary Nodules
Detected on CT Images: From the [HOSPITAL] 2349; Radiology

By: Medenu Rollex M.D.

## 2020-01-01 ENCOUNTER — Emergency Department (HOSPITAL_COMMUNITY)
Admission: EM | Admit: 2020-01-01 | Discharge: 2020-01-01 | Disposition: A | Discharge status: Home / Self Care | Payer: BC Managed Care – PPO | Attending: Emergency Medicine | Admitting: Emergency Medicine

## 2020-01-01 ENCOUNTER — Emergency Department (HOSPITAL_COMMUNITY): Payer: BC Managed Care – PPO

## 2020-01-01 ENCOUNTER — Other Ambulatory Visit: Payer: Self-pay

## 2020-01-01 ENCOUNTER — Encounter (HOSPITAL_COMMUNITY): Payer: Self-pay | Admitting: Emergency Medicine

## 2020-01-01 DIAGNOSIS — R071 Chest pain on breathing: Secondary | ICD-10-CM | POA: Insufficient documentation

## 2020-01-01 DIAGNOSIS — R0789 Other chest pain: Secondary | ICD-10-CM

## 2020-01-01 DIAGNOSIS — R079 Chest pain, unspecified: Secondary | ICD-10-CM | POA: Diagnosis present

## 2020-01-01 DIAGNOSIS — Z79899 Other long term (current) drug therapy: Secondary | ICD-10-CM | POA: Insufficient documentation

## 2020-01-01 DIAGNOSIS — D869 Sarcoidosis, unspecified: Secondary | ICD-10-CM | POA: Insufficient documentation

## 2020-01-01 LAB — CBC WITH DIFFERENTIAL/PLATELET
Abs Immature Granulocytes: 0.03 10*3/uL (ref 0.00–0.07)
Basophils Absolute: 0 10*3/uL (ref 0.0–0.1)
Basophils Relative: 1 %
Eosinophils Absolute: 0.1 10*3/uL (ref 0.0–0.5)
Eosinophils Relative: 1 %
HCT: 36.5 % (ref 36.0–46.0)
Hemoglobin: 12 g/dL (ref 12.0–15.0)
Immature Granulocytes: 0 %
Lymphocytes Relative: 22 %
Lymphs Abs: 1.7 10*3/uL (ref 0.7–4.0)
MCH: 34 pg (ref 26.0–34.0)
MCHC: 32.9 g/dL (ref 30.0–36.0)
MCV: 103.4 fL — ABNORMAL HIGH (ref 80.0–100.0)
Monocytes Absolute: 0.8 10*3/uL (ref 0.1–1.0)
Monocytes Relative: 11 %
Neutro Abs: 4.9 10*3/uL (ref 1.7–7.7)
Neutrophils Relative %: 65 %
Platelets: 227 10*3/uL (ref 150–400)
RBC: 3.53 MIL/uL — ABNORMAL LOW (ref 3.87–5.11)
RDW: 14.6 % (ref 11.5–15.5)
WBC: 7.6 10*3/uL (ref 4.0–10.5)
nRBC: 0 % (ref 0.0–0.2)

## 2020-01-01 LAB — BASIC METABOLIC PANEL
Anion gap: 14 (ref 5–15)
BUN: 7 mg/dL (ref 6–20)
CO2: 23 mmol/L (ref 22–32)
Calcium: 9.2 mg/dL (ref 8.9–10.3)
Chloride: 105 mmol/L (ref 98–111)
Creatinine, Ser: 0.77 mg/dL (ref 0.44–1.00)
GFR calc Af Amer: >60 mL/min (ref >60)
GFR calc non Af Amer: >60 mL/min (ref >60)
Glucose, Bld: 82 mg/dL (ref 70–99)
Potassium: 4.2 mmol/L (ref 3.5–5.1)
Sodium: 142 mmol/L (ref 135–145)

## 2020-01-01 LAB — LIPASE, BLOOD: Lipase: 24 U/L (ref 11–51)

## 2020-01-01 LAB — CBC
HCT: 33.4 % — ABNORMAL LOW (ref 36.0–46.0)
Hemoglobin: 10.8 g/dL — ABNORMAL LOW (ref 12.0–15.0)
MCH: 33 pg (ref 26.0–34.0)
MCHC: 32.3 g/dL (ref 30.0–36.0)
MCV: 102.1 fL — ABNORMAL HIGH (ref 80.0–100.0)
Platelets: 207 10*3/uL (ref 150–400)
RBC: 3.27 MIL/uL — ABNORMAL LOW (ref 3.87–5.11)
RDW: 14.5 % (ref 11.5–15.5)
WBC: 7.3 10*3/uL (ref 4.0–10.5)
nRBC: 0 % (ref 0.0–0.2)

## 2020-01-01 LAB — HEPATIC FUNCTION PANEL
ALT: 16 U/L (ref 0–44)
AST: 27 U/L (ref 15–41)
Albumin: 4.2 g/dL (ref 3.5–5.0)
Alkaline Phosphatase: 77 U/L (ref 38–126)
Bilirubin, Direct: 0.1 mg/dL (ref 0.0–0.2)
Indirect Bilirubin: 0.3 mg/dL (ref 0.3–0.9)
Total Bilirubin: 0.4 mg/dL (ref 0.3–1.2)
Total Protein: 7.9 g/dL (ref 6.5–8.1)

## 2020-01-01 LAB — TROPONIN I (HIGH SENSITIVITY)
Troponin I (High Sensitivity): 8 ng/L (ref <18)
Troponin I (High Sensitivity): 7 ng/L (ref <18)

## 2020-01-01 LAB — D-DIMER, QUANTITATIVE (NOT AT ARMC): D-Dimer, Quant: 0.29 ug/mL-FEU (ref 0.00–0.50)

## 2020-01-01 MED ORDER — PANTOPRAZOLE SODIUM 40 MG IV SOLR
40.0000 mg | Freq: Once | INTRAVENOUS | Status: AC
  Administered 2020-01-01: 11:00:00 40 mg via INTRAVENOUS
  Filled 2020-01-01: qty 40

## 2020-01-01 MED ORDER — KETOROLAC TROMETHAMINE 15 MG/ML IJ SOLN
15.0000 mg | Freq: Once | INTRAMUSCULAR | Status: AC
  Administered 2020-01-01: 15 mg via INTRAMUSCULAR
  Filled 2020-01-01: qty 1

## 2020-01-01 MED ORDER — STERILE WATER FOR INJECTION IJ SOLN
INTRAMUSCULAR | Status: AC
  Administered 2020-01-01: 4 mL
  Filled 2020-01-01: qty 10

## 2020-01-01 NOTE — ED Triage Notes (Signed)
Pt arrives to ED from from with [facility] with complaints of intermittent left sided chest pain since 11am yesterday. Patient states the pain is sharp, radiates to her left arm and comes and goes. Patient received 324 ASA and x1 nitro via EMS with minimal relief of pain.

## 2020-01-01 NOTE — ED Provider Notes (Signed)
Medical screening examination/treatment/procedure(s) were conducted as a shared visit with non-physician practitioner(s) and myself.  I personally evaluated the patient during the encounter.  EKG Interpretation  Date/Time:  Wednesday January 01 2020 10:14:35 EDT Ventricular Rate:  85 PR Interval:    QRS Duration: 79 QT Interval:  388 QTC Calculation: 462 R Axis:   71 Text Interpretation: Sinus rhythm RSR' in V1 or V2, probably normal variant Left ventricular hypertrophy agree. similar to previous with slight increse T wave peaking. Confirmed by Arby Barrette [phone]) on 01/01/2020 12:07:50 PM Patient experienced left-sided chest pain that radiated towards her left arm.  It occurred the day before presentation.  There were waxing and waning episodes.  Pain recurred again on the day of presentation from morning at about 6 AM until 9 AM.  Patient is alert and appropriate.  No respiratory distress.  Lungs are clear without wheeze rhonchi or rale.  Heart regular no rub murmur gallop.  Patient has focus of reproducible tenderness on the chest wall just above the left breast.  No palpable soft tissue abnormalities of the breast tissue.  Pain appears to be within the chest wall.  Abdomen soft nontender.  No peripheral edema calf soft nontender.  Patient has had diagnostic evaluation including normal serial troponin.  D-dimer not elevated.  At this time patient appears very low probability for cardiac ischemic etiology or pulmonary embolus.  She does have very reproducible chest wall pain on examination.  At this time I do have higher suspicion for musculoskeletal pain etiology.  Return precautions and follow-up plan reviewed.   Arby Barrette, MD 01/03/20 [phone]

## 2020-01-01 NOTE — Discharge Instructions (Addendum)
You have been diagnosed today with Atypical Chest Pain.  At this time there does not appear to be the presence of an emergent medical condition, however there is always the potential for conditions to change. Please read and follow the below instructions.  Please return to the Emergency Department immediately for any new or worsening symptoms. Please be sure to follow up with your Primary Care Provider within one week regarding your visit today; please call their office to schedule an appointment even if you are feeling better for a follow-up visit. You have been given an NSAID-containing medication called Toradol today.  Do not take the medications including ibuprofen, Aleve, Advil, naproxen or other NSAID-containing medications for the next 1 day.  Please be sure to drink plenty of water over the next few days. Starting tomorrow afternoon you can begin taking Naproxen as directed on the packaging to help with your symptoms. Please drink plenty of water and get plenty of rest. You EKG showed what appears to be Left Ventricular Hypertrophy. Please discuss this with your Primary Care Provider at your follow-up visit. They may want to schedule you for an Echocardiogram in the future, discuss this potential with them at your visit.  Get help right away if: Your chest pain is worse. You have a cough that gets worse, or you cough up blood. You have very bad (severe) pain in your belly (abdomen). You pass out (faint). You have either of these for no clear reason: Sudden chest discomfort. Sudden discomfort in your arms, back, neck, or jaw. You have shortness of breath at any time. You suddenly start to sweat, or your skin gets clammy. You feel sick to your stomach (nauseous). You throw up (vomit). You suddenly feel lightheaded or dizzy. You feel very weak or tired. Your heart starts to beat fast, or it feels like it is skipping beats. You have any new/concerning or worsening of symptoms  Please  read the additional information packets attached to your discharge summary.  Do not take your medicine if  develop an itchy rash, swelling in your mouth or lips, or difficulty breathing; call 911 and seek immediate emergency medical attention if this occurs.  Note: Portions of this text may have been transcribed using voice recognition software. Every effort was made to ensure accuracy; however, inadvertent computerized transcription errors may still be present.

## 2020-01-01 NOTE — ED Provider Notes (Addendum)
[facility] EMERGENCY DEPARTMENT Provider Note   CSN: [id number] Arrival date & time: 01/01/20  1007     History Chief Complaint  Patient presents with  . Chest Pain    Kristine Baldwin is a 49 y.o. female history sarcoidosis, thyroid disease otherwise healthy.  Patient presents today via EMS for chest pain that began yesterday at 11 AM.  She was working at her desk when pain began.  Describes left-sided chest pain that will occasionally radiate to her left arm, pain was moderate intensity yesterday, intermittent without clear inciting event or aggravating or alleviating factors.  When patient woke up this morning at 6 AM pain became more consistent and then worsened around 9 AM at work, she describes pain as moderate-severe at this time, she received full dose aspirin and 1 nitro by EMS.  Patient reports that since this morning she feels her pain is worsened with inspiration.  Of note patient reports that Monday, day before onset of chest pain she had one episode of nonbloody/nonbilious emesis and few episodes of small amount of nonbloody diarrhea, the symptoms resolved on Monday.  Denies fever/chills, headache, neck pain, cough/hemoptysis, abdominal pain, active vomiting/diarrhea, extremity swelling/color change or any additional concerns. HPI     Past Medical History:  Diagnosis Date  . Sarcoidosis   . Thyroid disease     There are no problems to display for this patient.   History reviewed. No pertinent surgical history.   OB History   No obstetric history on file.     Family History  Problem Relation Age of Onset  . Diabetes Mother   . Hypertension Mother   . Diabetes Father   . Hypertension Father     Social History   Tobacco Use  . Smoking status: Never Smoker  . Smokeless tobacco: Never Used  Substance Use Topics  . Alcohol use: Yes  . Drug use: Yes    Types: Marijuana    Home Medications Prior to Admission medications   Medication  Sig Start Date End Date Taking? Authorizing Provider  ibuprofen (ADVIL,MOTRIN) 200 MG tablet Take 600 mg by mouth every 6 (six) hours as needed (for pain or headaches).    [provider]  ibuprofen (ADVIL,MOTRIN) 800 MG tablet Take 1 tablet (800 mg total) by mouth every 8 (eight) hours as needed. Patient not taking: Reported on 09/24/2018 12/26/17   Long, Arlyss Repress, MD  predniSONE (STERAPRED UNI-PAK 21 TAB) 10 MG (21) TBPK tablet Take by mouth daily. Take as directed. Patient not taking: Reported on 09/24/2018 09/05/17   Mardella Layman, MD    Allergies    Patient has no known allergies.  Review of Systems   Review of Systems Ten systems are reviewed and are negative for acute change except as noted in the HPI  Physical Exam Updated Vital Signs BP 135/87   Pulse 78   Temp 98.5 F (36.9 C) (Oral)   Resp 15   Ht 5\' 7"  (1.702 m)   Wt 45.4 kg   SpO2 100%   BMI 15.66 kg/m   Physical Exam Constitutional:      General: She is not in acute distress.    Appearance: Normal appearance. She is well-developed. She is not ill-appearing or diaphoretic.  HENT:     Head: Normocephalic and atraumatic.     Right Ear: External ear normal.     Left Ear: External ear normal.     Nose: Nose normal.  Eyes:  General: Vision grossly intact. Gaze aligned appropriately.     Pupils: Pupils are equal, round, and reactive to light.  Neck:     Trachea: Trachea and phonation normal. No tracheal deviation.  Cardiovascular:     Rate and Rhythm: Normal rate and regular rhythm.     Pulses:          Dorsalis pedis pulses are 2+ on the right side and 2+ on the left side.     Heart sounds: Normal heart sounds.  Pulmonary:     Effort: Pulmonary effort is normal. No accessory muscle usage or respiratory distress.     Breath sounds: Normal breath sounds.  Chest:     Chest wall: Tenderness present. No deformity or crepitus.       Comments: No visible rash Abdominal:     General: There is no  distension.     Palpations: Abdomen is soft.     Tenderness: There is no abdominal tenderness. There is no guarding or rebound.  Musculoskeletal:        General: Normal range of motion.     Cervical back: Normal range of motion.     Right lower leg: No tenderness. No edema.     Left lower leg: No tenderness. No edema.  Skin:    General: Skin is warm and dry.  Neurological:     Mental Status: She is alert.     GCS: GCS eye subscore is 4. GCS verbal subscore is 5. GCS motor subscore is 6.     Comments: Speech is clear and goal oriented, follows commands Major Cranial nerves without deficit, no facial droop Moves extremities without ataxia, coordination intact  Psychiatric:        Behavior: Behavior normal.     ED Results / Procedures / Treatments   Labs (all labs ordered are listed, but only abnormal results are displayed) Labs Reviewed  CBC - Abnormal; Notable for the following components:      Result Value   RBC 3.27 (*)    Hemoglobin 10.8 (*)    HCT 33.4 (*)    MCV 102.1 (*)    All other components within normal limits  CBC WITH DIFFERENTIAL/PLATELET - Abnormal; Notable for the following components:   RBC 3.53 (*)    MCV 103.4 (*)    All other components within normal limits  BASIC METABOLIC PANEL  D-DIMER, QUANTITATIVE (NOT AT [facility])  LIPASE, BLOOD  HEPATIC FUNCTION PANEL  TROPONIN I (HIGH SENSITIVITY)  TROPONIN I (HIGH SENSITIVITY)    EKG EKG Interpretation  Date/Time:  Wednesday January 01 2020 10:14:35 EDT Ventricular Rate:  85 PR Interval:    QRS Duration: 79 QT Interval:  388 QTC Calculation: 462 R Axis:   71 Text Interpretation: Sinus rhythm RSR' in V1 or V2, probably normal variant Left ventricular hypertrophy agree. similar to previous with slight increse T wave peaking. Confirmed by Charlesetta Shanks [id number]) on 01/01/2020 12:07:50 PM   Radiology DG Chest Port 1 View  Result Date: 01/01/2020 CLINICAL DATA:  Intermittent LEFT-sided chest pain since 11  yesterday. Sharp and radiating to LEFT arm. EXAM: PORTABLE CHEST 1 VIEW COMPARISON:  Previous chest CT 12/26/2017 and chest x-ray of 12/26/2017 FINDINGS: Cardiomediastinal contours are normal. Hilar structures unremarkable. Lungs are clear. Signs of postoperative change in the LEFT lung base as before. Visualized skeletal structures are unremarkable. IMPRESSION: 1. No acute cardiopulmonary disease. 2. Signs of postoperative change in the LEFT lung base. Electronically Signed   By: Zetta Bills  M.D.   On: 01/01/2020 11:13    Procedures Procedures (including critical care time)  Medications Ordered in ED Medications  pantoprazole (PROTONIX) injection 40 mg (40 mg Intravenous Given 01/01/20 1114)  sterile water (preservative free) injection (4 mLs  Given 01/01/20 1115)  ketorolac (TORADOL) 15 MG/ML injection 15 mg (15 mg Intramuscular Given 01/01/20 1232)    ED Course  I have reviewed the triage vital signs and the nursing notes.  Pertinent labs & imaging results that were available during my care of the patient were reviewed by me and considered in my medical decision making (see chart for details).    MDM Rules/Calculators/A&P                     I have ordered reviewed and interpreted the following labs.  LFTs within normal limits no evidence of hepatitis or biliary obstruction.  D-dimer within normal limits and lowers suspicion for pulmonary embolism.  Lipase within normal limits no evidence of pancreatitis.  BMP within normal limits no emergent electrolyte derangement or evidence of kidney injury.  High-sensitivity troponin is within normal limits with onset of pain yesterday this is reassuring however as pain worsened that 9 AM will need a delta troponin.  CBC initially showed mild anemia of 10.8 however a CBC with differential was drawn today as well which shows hemoglobin of 12.0, doubt anemia as etiology of patient's pain today, additionally no leukocytosis to suggest infection.  CXR:    IMPRESSION:  1. No acute cardiopulmonary disease.  2. Signs of postoperative change in the LEFT lung base.  I personally reviewed and interpreted patient's chest x-ray, no obvious opacities suggestive of infection, no pneumothorax.  Agree with radiologist interpretation as above.  Postoperative changes are consistent with patient's history, she reports that she had a biopsy performed in [place] last year.  EKG: Sinus rhythm RSR' in V1 or V2, probably normal variant Left ventricular hypertrophy agree. similar to previous with slight increse T wave peaking. Confirmed by Arby Barrette [phone]) on 01/01/2020 12:07:50 PM - Patient reassessed, Protonix did not help with pain, 15 mg Toradol given for suspected costochondral pain.  Delta troponin is pending will reassess.  Plan of care discussed with Dr. Donnald Garre who will see patient shortly. - Repeat high-sensitivity troponin negative, no elevation from prior. - Patient seen and evaluated by Dr. Donnald Garre agrees with discharge and PCP follow-up. - No evidence for ACS, PE, dissection, pneumonia, anemia or other emergent pathologies of patient's pain today, I suspect his symptoms may be due to musculoskeletal pain, no indication for further work-up in the ER.  Incidentally patient noted to have LVH, she was informed of this finding and will follow up with her PCP to discuss potential of outpatient echo if indicated, no indication for admission or emergent echo at this time.  Patient appears stable for discharge, starting tomorrow will begin OTC anti-inflammatories as Toradol wears off.  At discharge patient was reevaluated resting comfortably no acute distress vital signs stable on room air.  At this time there does not appear to be any evidence of an acute emergency medical condition and the patient appears stable for discharge with appropriate outpatient follow up. Diagnosis was discussed with patient who verbalizes understanding of care plan and is  agreeable to discharge. I have discussed return precautions with patient who verbalizes understanding. Patient encouraged to follow-up with their PCP. All questions answered.  Patient's case discussed with Dr. Donnald Garre who agrees with plan to discharge with follow-up.  Note: Portions of this report may have been transcribed using voice recognition software. Every effort was made to ensure accuracy; however, inadvertent computerized transcription errors may still be present. Final Clinical Impression(s) / ED Diagnoses Final diagnoses:  Atypical chest pain  Costochondral pain    Rx / DC Orders ED Discharge Orders    None        Elizabeth Palau 01/01/20 1522    Arby Barrette, MD 01/03/20 [phone]

## 2020-06-30 ENCOUNTER — Other Ambulatory Visit: Payer: Self-pay

## 2020-06-30 ENCOUNTER — Encounter: Payer: Self-pay | Admitting: *Deleted

## 2020-06-30 ENCOUNTER — Ambulatory Visit
Admission: EM | Admit: 2020-06-30 | Discharge: 2020-06-30 | Disposition: A | Discharge status: Home / Self Care | Payer: BC Managed Care – PPO

## 2020-06-30 DIAGNOSIS — R112 Nausea with vomiting, unspecified: Secondary | ICD-10-CM

## 2020-06-30 DIAGNOSIS — Z20822 Contact with and (suspected) exposure to covid-19: Secondary | ICD-10-CM

## 2020-06-30 HISTORY — DX: Migraine, unspecified, not intractable, without status migrainosus: G43.909

## 2020-06-30 LAB — POCT URINALYSIS DIP (MANUAL ENTRY)
Blood, UA: NEGATIVE
Glucose, UA: NEGATIVE mg/dL
Leukocytes, UA: NEGATIVE
Nitrite, UA: NEGATIVE
Protein Ur, POC: 100 mg/dL — AB
Spec Grav, UA: >=1.03 — AB (ref 1.010–1.025)
Urobilinogen, UA: 0.2 E.U./dL
pH, UA: 6 (ref 5.0–8.0)

## 2020-06-30 MED ORDER — MELOXICAM 15 MG PO TABS: 15.0000 mg | ORAL_TABLET | Freq: Every day | ORAL | 0 refills | Status: AC

## 2020-06-30 MED ORDER — ONDANSETRON 4 MG PO TBDP
4.0000 mg | ORAL_TABLET | Freq: Once | ORAL | Status: AC
  Administered 2020-06-30: 4 mg via ORAL

## 2020-06-30 MED ORDER — ONDANSETRON HCL 4 MG PO TABS: 4.0000 mg | ORAL_TABLET | Freq: Three times a day (TID) | ORAL | 0 refills | Status: AC | PRN

## 2020-06-30 NOTE — Discharge Instructions (Signed)
Please take meloxicam for body aches, headache and pharyngitis. Please take Zofran every 8 hours for nausea and vomiting. Please try to drink 64 ounces of water a day.

## 2020-06-30 NOTE — ED Triage Notes (Signed)
Patient in with complaints of sore throat, abdominal pain, bilateral ear pain, severe headache and mouth pain x 2 days. Patient states she has a hx of migraines. Patient states due to pain in mouth she has not been able to eat or drink. Patient states she did experience vomiting on Sunday. Unable to state number of episodes of emesis but it was more than 3 per patient.Patient has taken Tylenol and Motrin at home which has not helped with pain.

## 2020-06-30 NOTE — ED Provider Notes (Signed)
Emergency Department Provider Note  ____________________________________________  Time seen: Approximately 11:47 AM  I have reviewed the triage vital signs and the nursing notes.   HISTORY  Chief Complaint Sore Throat, Abdominal Pain, Headache, and Otalgia   Historian Patient     HPI Kristine Baldwin is a 49 y.o. female presents to the emergency department with headache, bilateral ear pain, pharyngitis, emesis and diarrhea for the past 2 days.  Patient is also had some nasal congestion.  She states that she works at [employer] and has numerous potential sick contacts.  She denies abdominal pain but states that she has had some left-sided low back pain.  Denies dysuria, hematuria or increased urinary frequency.  Denies chest pain, chest tightness or shortness of breath.  No sick contacts within the home.   Past Medical History:  Diagnosis Date  . Migraines   . Sarcoidosis   . Thyroid disease      Immunizations up to date:  Yes.     Past Medical History:  Diagnosis Date  . Migraines   . Sarcoidosis   . Thyroid disease     There are no problems to display for this patient.   Past Surgical History:  Procedure Laterality Date  . LUNG BIOPSY     4 years ago    Prior to Admission medications   Medication Sig Start Date End Date Taking? Authorizing Provider  acetaminophen (TYLENOL) 325 MG tablet Take 650 mg by mouth every 6 (six) hours as needed.   Yes [provider]  ibuprofen (ADVIL,MOTRIN) 800 MG tablet Take 1 tablet (800 mg total) by mouth every 8 (eight) hours as needed. 12/26/17  Yes Long, Arlyss Repress, MD  ibuprofen (ADVIL,MOTRIN) 200 MG tablet Take 600 mg by mouth every 6 (six) hours as needed (for pain or headaches).    [provider]  predniSONE (STERAPRED UNI-PAK 21 TAB) 10 MG (21) TBPK tablet Take by mouth daily. Take as directed. Patient not taking: Reported on 09/24/2018 09/05/17   Mardella Layman, MD    Allergies Patient has no known  allergies.  Family History  Problem Relation Age of Onset  . Diabetes Mother   . Hypertension Mother   . Diabetes Father   . Hypertension Father     Social History Social History   Tobacco Use  . Smoking status: Current Every Day Smoker    Types: Cigarettes  . Smokeless tobacco: Never Used  Vaping Use  . Vaping Use: Never used  Substance Use Topics  . Alcohol use: Yes    Comment: occ  . Drug use: Yes    Types: Marijuana    Comment: occ     Review of Systems  Constitutional: Patient has chills.  Eyes:  No discharge ENT: Patient has pharyngitis.  Respiratory: no cough. No SOB/ use of accessory muscles to breath Gastrointestinal: Patient has emesis.  Musculoskeletal: Negative for musculoskeletal pain. Skin: Negative for rash, abrasions, lacerations, ecchymosis.    ____________________________________________   PHYSICAL EXAM:  VITAL SIGNS: ED Triage Vitals  Enc Vitals Group     BP 06/30/20 1133 (!) 143/84     Pulse Rate 06/30/20 1133 (!) 121     Resp 06/30/20 1133 20     Temp 06/30/20 1133 99.2 F (37.3 C)     Temp Source 06/30/20 1133 Oral     SpO2 06/30/20 1133 97 %     Weight --      Height --      Head Circumference --  Peak Flow --      Pain Score 06/30/20 1135 9     Pain Loc --      Pain Edu? --      Excl. in GC? --      Constitutional: Alert and oriented. Patient is lying supine. Eyes: Conjunctivae are normal. PERRL. EOMI. Head: Atraumatic. ENT:      Ears: Tympanic membranes are mildly injected with mild effusion bilaterally.       Nose: No congestion/rhinnorhea.      Mouth/Throat: Mucous membranes are moist. Posterior pharynx is mildly erythematous.  Hematological/Lymphatic/Immunilogical: No cervical lymphadenopathy.  Cardiovascular: Normal rate, regular rhythm. Normal S1 and S2.  Good peripheral circulation. Respiratory: Normal respiratory effort without tachypnea or retractions. Lungs CTAB. Good air entry to the bases with no  decreased or absent breath sounds. Gastrointestinal: Bowel sounds 4 quadrants. Soft and nontender to palpation. No guarding or rigidity. No palpable masses. No distention. No CVA tenderness. Musculoskeletal: Full range of motion to all extremities. No gross deformities appreciated. Neurologic:  Normal speech and language. No gross focal neurologic deficits are appreciated.  Skin:  Skin is warm, dry and intact. No rash noted. Psychiatric: Mood and affect are normal. Speech and behavior are normal. Patient exhibits appropriate insight and judgement.   ____________________________________________   LABS (all labs ordered are listed, but only abnormal results are displayed)  Labs Reviewed  NOVEL CORONAVIRUS, NAA  POCT URINALYSIS DIP (MANUAL ENTRY)   ____________________________________________  EKG   ____________________________________________  RADIOLOGY   No results found.  ____________________________________________    PROCEDURES  Procedure(s) performed:     Procedures     Medications  ondansetron (ZOFRAN-ODT) disintegrating tablet 4 mg (has no administration in time range)     ____________________________________________   INITIAL IMPRESSION / ASSESSMENT AND PLAN / ED COURSE  Pertinent labs & imaging results that were available during my care of the patient were reviewed by me and considered in my medical decision making (see chart for details).      Assessment and Plan:  Emesis Pharyngitis Nasal congestion 49 year old female presents to the urgent care with pharyngitis, nausea, bilateral ear pain, headache and malaise for the past 2 days.  Patient states that she is primarily presenting to the urgent care due to nausea and vomiting.  Patient was tachycardic at triage vital signs were otherwise reassuring.  Patient was given oral Zofran and was able to pass a p.o. challenge with 16 ounces of water.  Patient's heart rate trended down appropriately in  the urgent care.  Sendoff COVID-19 testing is in process at this time.  Urinalysis was obtained which revealed findings concerning for dehydration but was not suggestive of urinary tract infection.  Patient was discharged with meloxicam for headache and body aches and Zofran for nausea.  She was given return precautions to return to the urgent care with new or worsening symptoms.  She voiced understanding and a work note was provided prior to discharge.    ____________________________________________  FINAL CLINICAL IMPRESSION(S) / ED DIAGNOSES  Final diagnoses:  Encounter for screening laboratory testing for COVID-19 virus      NEW MEDICATIONS STARTED DURING THIS VISIT:  ED Discharge Orders    None          This chart was dictated using voice recognition software/Dragon. Despite best efforts to proofread, errors can occur which can change the meaning. Any change was purely unintentional.     Orvil Feil, PA-C 06/30/20 1241

## 2020-07-01 LAB — NOVEL CORONAVIRUS, NAA: SARS-CoV-2, NAA: NOT DETECTED

## 2020-07-01 LAB — SARS-COV-2, NAA 2 DAY TAT

## 2020-10-23 IMAGING — DX DG CHEST 1V PORT
1 series · 1 of 1 positions shown · non-contrast
Comparison: Previous chest CT 12/26/2017 and chest x-ray of
12/26/2017

CLINICAL DATA: Intermittent LEFT-sided chest pain since 11
yesterday. Sharp and radiating to LEFT arm.

EXAM:
PORTABLE CHEST 1 VIEW

[chest ap]
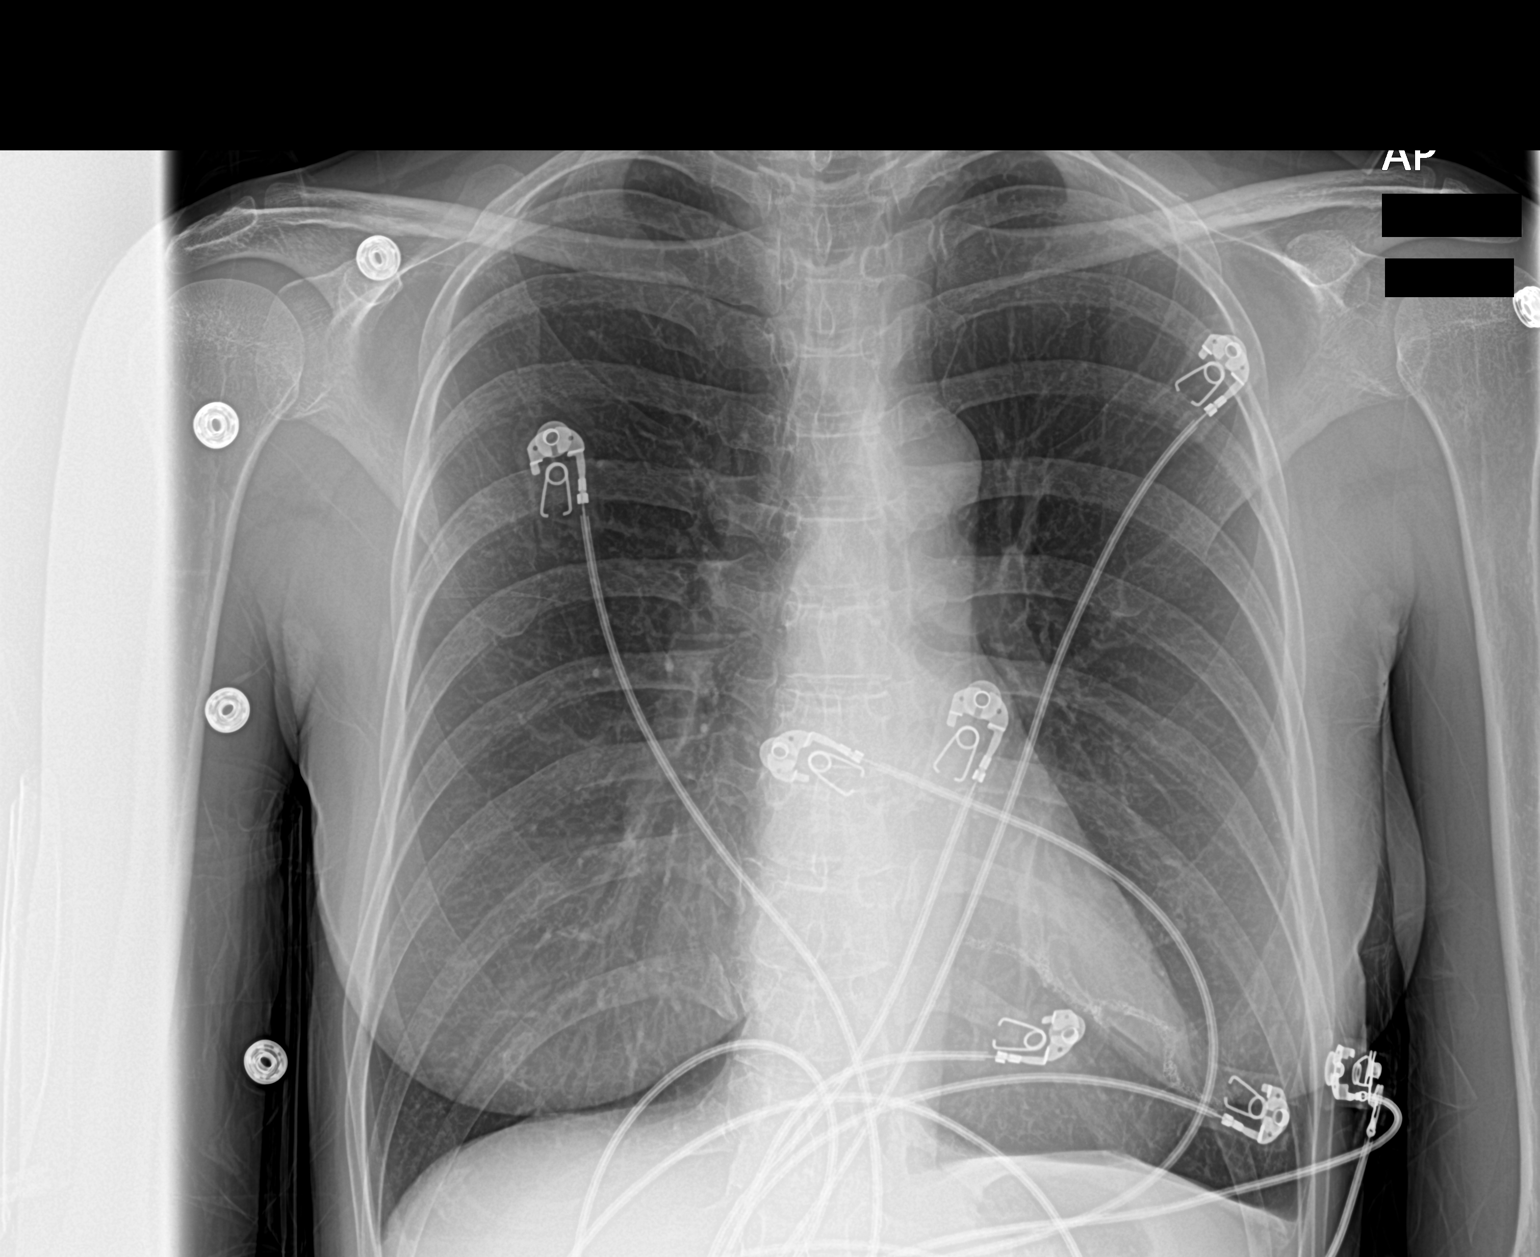

[1 of 1 positions shown; findings below may reference images not displayed]

FINDINGS: Cardiomediastinal contours are normal. Hilar structures
unremarkable.

Lungs are clear. Signs of postoperative change in the LEFT lung base
as before.

Visualized skeletal structures are unremarkable.
IMPRESSION: 1. No acute cardiopulmonary disease.
2. Signs of postoperative change in the LEFT lung base.

## 2020-12-11 ENCOUNTER — Ambulatory Visit
Admission: EM | Admit: 2020-12-11 | Discharge: 2020-12-11 | Disposition: A | Discharge status: Home / Self Care | Payer: BC Managed Care – PPO | Attending: Emergency Medicine | Admitting: Emergency Medicine

## 2020-12-11 DIAGNOSIS — R197 Diarrhea, unspecified: Secondary | ICD-10-CM

## 2020-12-11 DIAGNOSIS — Z20822 Contact with and (suspected) exposure to covid-19: Secondary | ICD-10-CM

## 2020-12-11 DIAGNOSIS — R030 Elevated blood-pressure reading, without diagnosis of hypertension: Secondary | ICD-10-CM

## 2020-12-11 DIAGNOSIS — R112 Nausea with vomiting, unspecified: Secondary | ICD-10-CM | POA: Diagnosis not present

## 2020-12-11 MED ORDER — ONDANSETRON 4 MG PO TBDP
4.0000 mg | ORAL_TABLET | Freq: Once | ORAL | Status: AC
  Administered 2020-12-11: 4 mg via ORAL

## 2020-12-11 MED ORDER — ONDANSETRON 4 MG PO TBDP: 4.0000 mg | ORAL_TABLET | Freq: Three times a day (TID) | ORAL | 0 refills | Status: AC | PRN

## 2020-12-11 NOTE — ED Triage Notes (Signed)
Pt c/o n/v/d since Tuesday. States had a cough and congestion last week.

## 2020-12-11 NOTE — ED Provider Notes (Signed)
[facility]    CSN: [id number] Arrival date & time: 12/11/20  1353      History   Chief Complaint Chief Complaint  Patient presents with  . Emesis    HPI Kristine Baldwin is a 50 y.o. female.   Patient presents with 4-day history of nausea, vomiting, diarrhea.  1 episode of emesis and 1 episode of diarrhea today.  Patient states she is able to keep herself hydrated at home.  She denies fever, chills, abdominal pain, dysuria, or other symptoms.  No treatments attempted at home.  Her medical history includes sarcoidosis, migraines, thyroid disease.  The history is provided by the patient and medical records.    Past Medical History:  Diagnosis Date  . Migraines   . Sarcoidosis   . Thyroid disease     There are no problems to display for this patient.   Past Surgical History:  Procedure Laterality Date  . LUNG BIOPSY     4 years ago    OB History   No obstetric history on file.      Home Medications    Prior to Admission medications   Medication Sig Start Date End Date Taking? Authorizing Provider  ondansetron (ZOFRAN ODT) 4 MG disintegrating tablet Take 1 tablet (4 mg total) by mouth every 8 (eight) hours as needed for nausea or vomiting. 12/11/20  Yes Mickie Bail, NP  acetaminophen (TYLENOL) 325 MG tablet Take 650 mg by mouth every 6 (six) hours as needed.    [provider]  ibuprofen (ADVIL,MOTRIN) 200 MG tablet Take 600 mg by mouth every 6 (six) hours as needed (for pain or headaches).    [provider]    Family History Family History  Problem Relation Age of Onset  . Diabetes Mother   . Hypertension Mother   . Diabetes Father   . Hypertension Father     Social History Social History   Tobacco Use  . Smoking status: Current Every Day Smoker    Types: Cigarettes  . Smokeless tobacco: Never Used  Vaping Use  . Vaping Use: Never used  Substance Use Topics  . Alcohol use: Yes    Comment: occ  . Drug use: Yes     Types: Marijuana    Comment: occ     Allergies   Patient has no known allergies.   Review of Systems Review of Systems  Constitutional: Negative for chills and fever.  HENT: Negative for ear pain and sore throat.   Eyes: Negative for pain and visual disturbance.  Respiratory: Negative for cough and shortness of breath.   Cardiovascular: Negative for chest pain and palpitations.  Gastrointestinal: Positive for diarrhea, nausea and vomiting. Negative for abdominal pain.  Genitourinary: Negative for dysuria and hematuria.  Musculoskeletal: Negative for arthralgias and back pain.  Skin: Negative for color change and rash.  Neurological: Negative for seizures and syncope.  All other systems reviewed and are negative.    Physical Exam Triage Vital Signs ED Triage Vitals  Enc Vitals Group     BP      Pulse      Resp      Temp      Temp src      SpO2      Weight      Height      Head Circumference      Peak Flow      Pain Score      Pain Loc  Pain Edu?      Excl. in GC?    No data found.  Updated Vital Signs BP (!) 185/110 (BP Location: Left Arm)   Pulse 91   Temp 98 F (36.7 C) (Oral)   Resp 18   LMP 09/07/2017 (Approximate)   SpO2 100%   Visual Acuity Right Eye Distance:   Left Eye Distance:   Bilateral Distance:    Right Eye Near:   Left Eye Near:    Bilateral Near:     Physical Exam Vitals and nursing note reviewed.  Constitutional:      General: She is not in acute distress.    Appearance: She is well-developed. She is not ill-appearing.  HENT:     Head: Normocephalic and atraumatic.     Mouth/Throat:     Mouth: Mucous membranes are moist.     Pharynx: Oropharynx is clear.  Eyes:     Conjunctiva/sclera: Conjunctivae normal.  Cardiovascular:     Rate and Rhythm: Normal rate and regular rhythm.     Heart sounds: Normal heart sounds.  Pulmonary:     Effort: Pulmonary effort is normal. No respiratory distress.     Breath sounds: Normal  breath sounds.  Abdominal:     General: Bowel sounds are normal.     Palpations: Abdomen is soft.     Tenderness: There is no abdominal tenderness. There is no right CVA tenderness, left CVA tenderness, guarding or rebound.  Musculoskeletal:     Cervical back: Neck supple.  Skin:    General: Skin is warm and dry.  Neurological:     General: No focal deficit present.     Mental Status: She is alert and oriented to person, place, and time.     Gait: Gait normal.  Psychiatric:        Mood and Affect: Mood normal.        Behavior: Behavior normal.      UC Treatments / Results  Labs (all labs ordered are listed, but only abnormal results are displayed) Labs Reviewed  COVID-19, FLU A+B NAA    EKG   Radiology No results found.  Procedures Procedures (including critical care time)  Medications Ordered in UC Medications  ondansetron (ZOFRAN-ODT) disintegrating tablet 4 mg (4 mg Oral Given 12/11/20 1422)    Initial Impression / Assessment and Plan / UC Course  I have reviewed the triage vital signs and the nursing notes.  Pertinent labs & imaging results that were available during my care of the patient were reviewed by me and considered in my medical decision making (see chart for details).   Nausea, vomiting, diarrhea.  Elevated blood pressure reading.  Screening for COVID-19.  Patient given Zofran here and able to tolerate oral fluids without emesis.  Treating at home with Zofran and continued hydration with clear liquids.  ED precautions discussed if patient is not able to keep herself hydrated at home.  Also discussed with patient that her blood pressure is elevated today and needs to be rechecked in the next couple of weeks.  She does not have a PCP.  [place] assistance requested with establishing a primary care provider.  Patient agrees to plan of care.   Final Clinical Impressions(s) / UC Diagnoses   Final diagnoses:  Nausea vomiting and diarrhea  Elevated blood  pressure reading  Encounter for screening laboratory testing for COVID-19 virus     Discharge Instructions     Take the antinausea medication as directed.  Keep yourself hydrated with clear liquids, such as water, Gatorade, Pedialyte, Sprite, or ginger ale.    Go to the emergency department if you have acute worsening symptoms.    Your blood pressure is elevated today at 176/106; repeat 185/110.  Please have this rechecked in 1-2 weeks.      Establish a primary care provider soon as possible. [place] assistance has been requested.         ED Prescriptions    Medication Sig Dispense Auth. Provider   ondansetron (ZOFRAN ODT) 4 MG disintegrating tablet Take 1 tablet (4 mg total) by mouth every 8 (eight) hours as needed for nausea or vomiting. 20 tablet Mickie Bail, NP     PDMP not reviewed this encounter.   Mickie Bail, NP 12/11/20 1454

## 2020-12-11 NOTE — Discharge Instructions (Addendum)
Take the antinausea medication as directed.    Keep yourself hydrated with clear liquids, such as water, Gatorade, Pedialyte, Sprite, or ginger ale.    Go to the emergency department if you have acute worsening symptoms.    Your blood pressure is elevated today at 176/106; repeat 185/110.  Please have this rechecked in 1-2 weeks.      Establish a primary care provider soon as possible. [place] assistance has been requested.

## 2020-12-12 LAB — COVID-19, FLU A+B NAA
Influenza A, NAA: NOT DETECTED
Influenza B, NAA: NOT DETECTED
SARS-CoV-2, NAA: NOT DETECTED

## 2020-12-22 ENCOUNTER — Ambulatory Visit: Payer: BC Managed Care – PPO | Admitting: Physician Assistant

## 2022-11-08 DIAGNOSIS — D863 Sarcoidosis of skin: Secondary | ICD-10-CM | POA: Insufficient documentation

## 2022-11-17 DIAGNOSIS — R636 Underweight: Secondary | ICD-10-CM | POA: Insufficient documentation

## 2023-01-26 ENCOUNTER — Ambulatory Visit: Payer: BC Managed Care – PPO | Admitting: Family Medicine

## 2023-04-06 ENCOUNTER — Ambulatory Visit: Payer: BC Managed Care – PPO | Admitting: Family Medicine

## 2023-04-12 ENCOUNTER — Encounter: Payer: BC Managed Care – PPO | Admitting: Family

## 2023-04-12 NOTE — Progress Notes (Signed)
Erroneous encounter-disregard

## 2023-06-21 ENCOUNTER — Telehealth: Payer: Self-pay | Admitting: Family

## 2023-06-21 ENCOUNTER — Other Ambulatory Visit (HOSPITAL_COMMUNITY)
Admission: RE | Admit: 2023-06-21 | Discharge: 2023-06-21 | Disposition: A | Discharge status: Home / Self Care | Payer: Medicaid Other | Source: Ambulatory Visit | Attending: Family | Admitting: Family

## 2023-06-21 ENCOUNTER — Other Ambulatory Visit: Payer: Self-pay | Admitting: Family

## 2023-06-21 ENCOUNTER — Other Ambulatory Visit: Payer: Self-pay

## 2023-06-21 ENCOUNTER — Ambulatory Visit: Payer: Medicaid Other | Admitting: Family

## 2023-06-21 ENCOUNTER — Encounter: Payer: Self-pay | Admitting: Family

## 2023-06-21 VITALS — BP 158/99 | HR 105 | Temp 99.4°F | Ht 67.5 in | Wt 93.4 lb

## 2023-06-21 DIAGNOSIS — Z114 Encounter for screening for human immunodeficiency virus [HIV]: Secondary | ICD-10-CM

## 2023-06-21 DIAGNOSIS — M549 Dorsalgia, unspecified: Secondary | ICD-10-CM | POA: Diagnosis not present

## 2023-06-21 DIAGNOSIS — Z7689 Persons encountering health services in other specified circumstances: Secondary | ICD-10-CM

## 2023-06-21 DIAGNOSIS — D869 Sarcoidosis, unspecified: Secondary | ICD-10-CM | POA: Diagnosis not present

## 2023-06-21 DIAGNOSIS — R109 Unspecified abdominal pain: Secondary | ICD-10-CM | POA: Insufficient documentation

## 2023-06-21 DIAGNOSIS — I10 Essential (primary) hypertension: Secondary | ICD-10-CM

## 2023-06-21 DIAGNOSIS — N939 Abnormal uterine and vaginal bleeding, unspecified: Secondary | ICD-10-CM | POA: Diagnosis not present

## 2023-06-21 DIAGNOSIS — N3001 Acute cystitis with hematuria: Secondary | ICD-10-CM

## 2023-06-21 DIAGNOSIS — Z780 Asymptomatic menopausal state: Secondary | ICD-10-CM

## 2023-06-21 LAB — POCT URINALYSIS DIP (CLINITEK)
Bilirubin, UA: NEGATIVE
Glucose, UA: NEGATIVE mg/dL
Ketones, POC UA: NEGATIVE mg/dL
Nitrite, UA: POSITIVE — AB
POC PROTEIN,UA: 100 — AB
Spec Grav, UA: 1.025 (ref 1.010–1.025)
Urobilinogen, UA: 1 U/dL
pH, UA: 6.5 (ref 5.0–8.0)

## 2023-06-21 MED ORDER — KETOROLAC TROMETHAMINE 30 MG/ML IJ SOLN
30.0000 mg | Freq: Once | INTRAMUSCULAR | Status: AC
  Administered 2023-06-21: 30 mg via INTRAMUSCULAR

## 2023-06-21 MED ORDER — VALSARTAN 40 MG PO TABS: 40.0000 mg | ORAL_TABLET | Freq: Every day | ORAL | 1 refills | Status: DC

## 2023-06-21 MED ORDER — NITROFURANTOIN MONOHYD MACRO 100 MG PO CAPS: 100.0000 mg | ORAL_CAPSULE | Freq: Two times a day (BID) | ORAL | 0 refills | Status: AC

## 2023-06-21 NOTE — Progress Notes (Signed)
Patient states she has a pain in lower abdomen and pain that shoots through top of spine in shoulders.  Declined Flu

## 2023-06-21 NOTE — Progress Notes (Signed)
Subjective:    Kristine Baldwin - 52 y.o. female MRN [id number]  Date of birth: 1971/04/14  HPI  Kristine Baldwin is to establish care.   Current issues and/or concerns: - Right lower abdominal pain for several days. Denies red flag symptoms.  - States menopause for several years. Reports recently intermittent vaginal bleeding. She denies additional symptoms. - Checking blood pressure at home and higher than normal. She is trying to monitor salt intake. She does not exercise outside of her normal routine. She does not complain of red flag symptoms such as but not limited to chest pain, shortness of breath, worst headache of life, nausea/vomiting.  - History of sarcoidosis which causes skin rash of legs. Reports she was established with specialist in the past.  - Upper back pain for 1 week. Denies recent trauma/injury and red flag symptoms.  - No further issues/concerns for discussion today.    ROS per HPI    Health Maintenance:  Health Maintenance Due  Topic Date Due   HIV Screening  Never done   Hepatitis C Screening  Never done   DTaP/Tdap/Td (1 - Tdap) Never done   Cervical Cancer Screening (HPV/Pap Cotest)  Never done   Colonoscopy  Never done   MAMMOGRAM  Never done   Zoster Vaccines- Shingrix (1 of 2) Never done   INFLUENZA VACCINE  Never done   COVID-19 Vaccine (1 - 2023-24 season) Never done     Past Medical History: There are no problems to display for this patient.     Social History   reports that she has been smoking cigarettes. She has never used smokeless tobacco. She reports current alcohol use. She reports current drug use. Drug: Marijuana.   Family History  family history includes Diabetes in her father and mother; Hypertension in her father and mother.   Medications: reviewed and updated   Objective:   Physical Exam BP (!) 158/99   Pulse (!) 105   Temp 99.4 F (37.4 C) (Oral)   Ht 5' 7.5" (1.715 m)   Wt 93 lb 6.4 oz (42.4 kg)   LMP 09/07/2017  (Approximate)   SpO2 97%   BMI 14.41 kg/m   Physical Exam HENT:     Head: Normocephalic and atraumatic.     Nose: Nose normal.     Mouth/Throat:     Mouth: Mucous membranes are moist.     Pharynx: Oropharynx is clear.  Eyes:     Extraocular Movements: Extraocular movements intact.     Conjunctiva/sclera: Conjunctivae normal.     Pupils: Pupils are equal, round, and reactive to light.  Cardiovascular:     Rate and Rhythm: Normal rate and regular rhythm.     Pulses: Normal pulses.     Heart sounds: Normal heart sounds.  Pulmonary:     Effort: Pulmonary effort is normal.     Breath sounds: Normal breath sounds.  Musculoskeletal:        General: Normal range of motion.     Right shoulder: Normal.     Left shoulder: Normal.     Right upper arm: Normal.     Left upper arm: Normal.     Right elbow: Normal.     Left elbow: Normal.     Right forearm: Normal.     Left forearm: Normal.     Right wrist: Normal.     Left wrist: Normal.     Right hand: Normal.     Left hand: Normal.  Cervical back: Normal, normal range of motion and neck supple.     Thoracic back: Normal.     Lumbar back: Normal.     Right hip: Normal.     Left hip: Normal.     Right upper leg: Normal.     Left upper leg: Normal.     Right knee: Normal.     Left knee: Normal.     Right lower leg: Normal.     Left lower leg: Normal.     Right ankle: Normal.     Left ankle: Normal.     Right foot: Normal.     Left foot: Normal.  Skin:    General: Skin is warm and dry.     Findings: Rash present.     Comments: Hyperpigmented rash left lower extremity, no drainage, skin intact.   Neurological:     General: No focal deficit present.     Mental Status: She is alert and oriented to person, place, and time.  Psychiatric:        Mood and Affect: Mood normal.        Behavior: Behavior normal.       Assessment & Plan:  1. Encounter to establish care - Patient presents today to establish care. During the  interim follow-up with primary provider as scheduled.  - Return for annual physical examination, labs, and health maintenance. Arrive fasting meaning having no food for at least 8 hours prior to appointment. You may have only water or black coffee. Please take scheduled medications as normal.  2. Primary hypertension - New onset.  - Blood pressure not at goal during today's visit. Patient asymptomatic without chest pressure, chest pain, palpitations, shortness of breath, worst headache of life, and any additional red flag symptoms. - Valsartan as prescribed.  - Routine screening.  - Counseled on blood pressure goal of less than 130/80, low-sodium, DASH diet, medication compliance, and 150 minutes of moderate intensity exercise per week as tolerated. Counseled on medication adherence and adverse effects. - Follow-up with primary provider in 2 weeks or sooner if needed for blood pressure check. - valsartan (DIOVAN) 40 MG tablet; Take 1 tablet (40 mg total) by mouth daily.  Dispense: 30 tablet; Refill: 1 - CMP14+EGFR  3. Menopause 4. Vaginal bleeding - Referral to Gynecology for evaluation/management. - Ambulatory referral to Gynecology  5. Abdominal pain, unspecified abdominal location - Routine screening.  - POCT URINALYSIS DIP (CLINITEK); Future - Cervicovaginal ancillary only - CBC - Amylase - Lipase  6. Sarcoidosis - Referral to Pulmonology for evaluation/management. - Ambulatory referral to Pulmonology  7. Upper back pain - Ketorolac administered in office.  - Follow-up with primary provider as scheduled.  - ketorolac (TORADOL) 30 MG/ML injection 30 mg  8. Encounter for screening for HIV - Routine screening.  - HIV antibody (with reflex)   Patient was given clear instructions to go to Emergency Department or return to medical center if symptoms don't improve, worsen, or new problems develop.The patient verbalized understanding.  I discussed the assessment and treatment  plan with the patient. The patient was provided an opportunity to ask questions and all were answered. The patient agreed with the plan and demonstrated an understanding of the instructions.   The patient was advised to call back or seek an in-person evaluation if the symptoms worsen or if the condition fails to improve as anticipated.    Ricky Stabs, NP 06/21/2023, 1:38 PM Primary Care at [facility]

## 2023-06-21 NOTE — Telephone Encounter (Signed)
Pharmacy has been updated.

## 2023-06-21 NOTE — Telephone Encounter (Signed)
Pt would like prescriptions sent to [facility] on [address]

## 2023-06-22 ENCOUNTER — Other Ambulatory Visit: Payer: Self-pay | Admitting: Family

## 2023-06-22 DIAGNOSIS — R109 Unspecified abdominal pain: Secondary | ICD-10-CM

## 2023-06-22 LAB — CERVICOVAGINAL ANCILLARY ONLY
Bacterial Vaginitis (gardnerella): POSITIVE — AB
Candida Glabrata: NEGATIVE
Candida Vaginitis: NEGATIVE
Chlamydia: NEGATIVE
Comment: NEGATIVE
Comment: NEGATIVE
Comment: NEGATIVE
Comment: NEGATIVE
Comment: NEGATIVE
Comment: NORMAL
Neisseria Gonorrhea: NEGATIVE
Trichomonas: NEGATIVE

## 2023-06-22 LAB — CMP14+EGFR
ALT: 7 [IU]/L (ref 0–32)
AST: 19 [IU]/L (ref 0–40)
Albumin: 4.7 g/dL (ref 3.8–4.9)
Alkaline Phosphatase: 129 [IU]/L — ABNORMAL HIGH (ref 44–121)
BUN/Creatinine Ratio: 10 (ref 9–23)
BUN: 9 mg/dL (ref 6–24)
Bilirubin Total: 0.9 mg/dL (ref 0.0–1.2)
CO2: 24 mmol/L (ref 20–29)
Calcium: 10 mg/dL (ref 8.7–10.2)
Chloride: 95 mmol/L — ABNORMAL LOW (ref 96–106)
Creatinine, Ser: 0.9 mg/dL (ref 0.57–1.00)
Globulin, Total: 3.5 g/dL (ref 1.5–4.5)
Glucose: 76 mg/dL (ref 70–99)
Potassium: 4 mmol/L (ref 3.5–5.2)
Sodium: 140 mmol/L (ref 134–144)
Total Protein: 8.2 g/dL (ref 6.0–8.5)
eGFR: 76 mL/min/{1.73_m2} (ref >59)

## 2023-06-22 LAB — CBC
Hematocrit: 40.2 % (ref 34.0–46.6)
Hemoglobin: 13.4 g/dL (ref 11.1–15.9)
MCH: 34.4 pg — ABNORMAL HIGH (ref 26.6–33.0)
MCHC: 33.3 g/dL (ref 31.5–35.7)
MCV: 103 fL — ABNORMAL HIGH (ref 79–97)
Platelets: 231 10*3/uL (ref 150–450)
RBC: 3.89 x10E6/uL (ref 3.77–5.28)
RDW: 13.9 % (ref 11.7–15.4)
WBC: 10.7 10*3/uL (ref 3.4–10.8)

## 2023-06-22 LAB — HIV ANTIBODY (ROUTINE TESTING W REFLEX): HIV Screen 4th Generation wRfx: NONREACTIVE

## 2023-06-22 LAB — AMYLASE: Amylase: 111 U/L — ABNORMAL HIGH (ref 31–110)

## 2023-06-22 LAB — LIPASE: Lipase: 14 U/L (ref 14–72)

## 2023-06-23 ENCOUNTER — Other Ambulatory Visit: Payer: Self-pay | Admitting: Family

## 2023-06-23 DIAGNOSIS — B9689 Other specified bacterial agents as the cause of diseases classified elsewhere: Secondary | ICD-10-CM | POA: Insufficient documentation

## 2023-06-23 MED ORDER — METRONIDAZOLE 500 MG PO TABS: 500.0000 mg | ORAL_TABLET | Freq: Two times a day (BID) | ORAL | 0 refills | Status: AC

## 2023-07-12 ENCOUNTER — Encounter: Payer: Self-pay | Admitting: Family

## 2023-07-12 ENCOUNTER — Ambulatory Visit: Payer: Medicaid Other | Admitting: Family

## 2023-07-12 VITALS — BP 132/87 | HR 82 | Temp 98.5°F | Ht 67.5 in | Wt 91.8 lb

## 2023-07-12 DIAGNOSIS — Z681 Body mass index (BMI) 19 or less, adult: Secondary | ICD-10-CM

## 2023-07-12 DIAGNOSIS — Z13228 Encounter for screening for other metabolic disorders: Secondary | ICD-10-CM

## 2023-07-12 DIAGNOSIS — I10 Essential (primary) hypertension: Secondary | ICD-10-CM | POA: Diagnosis not present

## 2023-07-12 DIAGNOSIS — R630 Anorexia: Secondary | ICD-10-CM

## 2023-07-12 MED ORDER — VALSARTAN 40 MG PO TABS: 40.0000 mg | ORAL_TABLET | Freq: Every day | ORAL | 0 refills | Status: DC

## 2023-07-12 NOTE — Progress Notes (Signed)
Patient ID: Kristine Baldwin, female    DOB: 1970-12-11  MRN: [id number]  CC: Chronic Conditions Follow-Up  Subjective: Kristine Baldwin is a 52 y.o. female who presents for chronic conditions follow-up.   Her concerns today include:  - Doing well on Valsartan, no issues/concerns. She does not complain of red flag symptoms such as but not limited to chest pain, shortness of breath, worst headache of life, nausea/vomiting.  - Patient reports Pulmonology did not call her since previous office visit. Patient provided with referral contact information and aware to update primary provider if she experiences difficulties scheduling an appointment. Referral contact information  Address: [facility] [address] [place], [place] [address] - Reports decreased appetite x 2 weeks. States "nothing tastes right". States she has mostly been drinking water and orange juice.   Patient Active Problem List   Diagnosis Date Noted   BV (bacterial vaginosis) 06/23/2023     Current Outpatient Medications on File Prior to Visit  Medication Sig Dispense Refill   acetaminophen (TYLENOL) 325 MG tablet Take 650 mg by mouth every 6 (six) hours as needed.     ondansetron (ZOFRAN ODT) 4 MG disintegrating tablet Take 1 tablet (4 mg total) by mouth every 8 (eight) hours as needed for nausea or vomiting. 20 tablet 0   No current facility-administered medications on file prior to visit.    No Known Allergies  Social History   Socioeconomic History   Marital status: Married    Spouse name: Not on file   Number of children: Not on file   Years of education: Not on file   Highest education level: Not on file  Occupational History   Not on file  Tobacco Use   Smoking status: Every Day    Types: Cigarettes   Smokeless tobacco: Never  Vaping Use   Vaping status: Never Used  Substance and Sexual Activity   Alcohol use: Yes    Comment: occ   Drug use: Yes    Types: Marijuana    Comment: occ    Sexual activity: Not on file  Other Topics Concern   Not on file  Social History Narrative   Not on file   Social Determinants of Health   Financial Resource Strain: Not on file  Food Insecurity: Not on file  Transportation Needs: Not on file  Physical Activity: Not on file  Stress: Not on file  Social Connections: Not on file  Intimate Partner Violence: Not on file    Family History  Problem Relation Age of Onset   Diabetes Mother    Hypertension Mother    Diabetes Father    Hypertension Father     Past Surgical History:  Procedure Laterality Date   LUNG BIOPSY     4 years ago    ROS: Review of Systems Negative except as stated above  PHYSICAL EXAM: BP 132/87   Pulse 82   Temp 98.5 F (36.9 C) (Oral)   Ht 5' 7.5" (1.715 m)   Wt 91 lb 12.8 oz (41.6 kg)   LMP 09/07/2017 (Approximate)   SpO2 96%   BMI 14.17 kg/m   Physical Exam HENT:     Head: Normocephalic and atraumatic.     Nose: Nose normal.     Mouth/Throat:     Mouth: Mucous membranes are moist.     Pharynx: Oropharynx is clear.  Eyes:     Extraocular Movements: Extraocular movements intact.     Conjunctiva/sclera: Conjunctivae  normal.     Pupils: Pupils are equal, round, and reactive to light.  Cardiovascular:     Rate and Rhythm: Normal rate and regular rhythm.     Pulses: Normal pulses.     Heart sounds: Normal heart sounds.  Pulmonary:     Effort: Pulmonary effort is normal.     Breath sounds: Normal breath sounds.  Musculoskeletal:        General: Normal range of motion.     Cervical back: Normal range of motion and neck supple.  Neurological:     General: No focal deficit present.     Mental Status: She is alert and oriented to person, place, and time.  Psychiatric:        Mood and Affect: Mood normal.        Behavior: Behavior normal.     ASSESSMENT AND PLAN: 1. Primary hypertension - Continue Valsartan as prescribed.  - Routine screening.  - Counseled on blood pressure goal  of less than 130/80, low-sodium, DASH diet, medication compliance, and 150 minutes of moderate intensity exercise per week as tolerated. Counseled on medication adherence and adverse effects. - Follow-up with primary provider in 3 months or sooner if needed. - valsartan (DIOVAN) 40 MG tablet; Take 1 tablet (40 mg total) by mouth daily.  Dispense: 90 tablet; Refill: 0 - Basic Metabolic Panel  2. Decreased appetite 3. BMI less than 19,adult - Routine screening.  - Referral to Medical Weight Management for evaluation/management.  - COVID-19, Flu A+B and RSV - Amb Ref to Medical Weight Management  4. Screening for metabolic disorder - Routine screening.  - Amylase   Patient was given the opportunity to ask questions.  Patient verbalized understanding of the plan and was able to repeat key elements of the plan. Patient was given clear instructions to go to Emergency Department or return to medical center if symptoms don't improve, worsen, or new problems develop.The patient verbalized understanding.   Orders Placed This Encounter  Procedures   COVID-19, Flu A+B and RSV   Amylase   Basic Metabolic Panel   Amb Ref to Medical Weight Management     Requested Prescriptions   Signed Prescriptions Disp Refills   valsartan (DIOVAN) 40 MG tablet 90 tablet 0    Sig: Take 1 tablet (40 mg total) by mouth daily.    Return in about 3 months (around 10/12/2023) for Follow-Up or next available chronic conditions.  Rema Fendt, NP

## 2023-07-12 NOTE — Progress Notes (Signed)
Patient wants to speak about her appetite  Declined Flu vaccine.

## 2023-07-13 ENCOUNTER — Other Ambulatory Visit: Payer: Self-pay | Admitting: Family

## 2023-07-13 DIAGNOSIS — R748 Abnormal levels of other serum enzymes: Secondary | ICD-10-CM

## 2023-07-13 LAB — BASIC METABOLIC PANEL
BUN/Creatinine Ratio: 10 (ref 9–23)
BUN: 8 mg/dL (ref 6–24)
CO2: 28 mmol/L (ref 20–29)
Calcium: 9.5 mg/dL (ref 8.7–10.2)
Chloride: 96 mmol/L (ref 96–106)
Creatinine, Ser: 0.8 mg/dL (ref 0.57–1.00)
Glucose: 85 mg/dL (ref 70–99)
Potassium: 4.1 mmol/L (ref 3.5–5.2)
Sodium: 139 mmol/L (ref 134–144)
eGFR: 88 mL/min/{1.73_m2} (ref >59)

## 2023-07-13 LAB — AMYLASE: Amylase: 144 U/L — ABNORMAL HIGH (ref 31–110)

## 2023-07-14 LAB — COVID-19, FLU A+B AND RSV
Influenza A, NAA: NOT DETECTED
Influenza B, NAA: NOT DETECTED
RSV, NAA: NOT DETECTED
SARS-CoV-2, NAA: NOT DETECTED

## 2023-08-08 ENCOUNTER — Other Ambulatory Visit (INDEPENDENT_AMBULATORY_CARE_PROVIDER_SITE_OTHER): Payer: Medicaid Other

## 2023-08-08 ENCOUNTER — Ambulatory Visit (INDEPENDENT_AMBULATORY_CARE_PROVIDER_SITE_OTHER): Payer: Medicaid Other | Admitting: Gastroenterology

## 2023-08-08 ENCOUNTER — Encounter: Payer: Self-pay | Admitting: Gastroenterology

## 2023-08-08 VITALS — BP 142/80 | HR 84 | Ht 67.5 in | Wt 97.4 lb

## 2023-08-08 DIAGNOSIS — K5900 Constipation, unspecified: Secondary | ICD-10-CM | POA: Diagnosis not present

## 2023-08-08 DIAGNOSIS — R1030 Lower abdominal pain, unspecified: Secondary | ICD-10-CM | POA: Diagnosis not present

## 2023-08-08 DIAGNOSIS — Z1211 Encounter for screening for malignant neoplasm of colon: Secondary | ICD-10-CM

## 2023-08-08 DIAGNOSIS — R748 Abnormal levels of other serum enzymes: Secondary | ICD-10-CM | POA: Diagnosis not present

## 2023-08-08 DIAGNOSIS — F1091 Alcohol use, unspecified, in remission: Secondary | ICD-10-CM | POA: Diagnosis not present

## 2023-08-08 DIAGNOSIS — R109 Unspecified abdominal pain: Secondary | ICD-10-CM

## 2023-08-08 LAB — GAMMA GT: GGT: 81 U/L — ABNORMAL HIGH (ref 7–51)

## 2023-08-08 LAB — HEPATIC FUNCTION PANEL
ALT: 10 U/L (ref 0–35)
AST: 17 U/L (ref 0–37)
Albumin: 4.3 g/dL (ref 3.5–5.2)
Alkaline Phosphatase: 76 U/L (ref 39–117)
Bilirubin, Direct: 0.1 mg/dL (ref 0.0–0.3)
Total Bilirubin: 0.4 mg/dL (ref 0.2–1.2)
Total Protein: 7.5 g/dL (ref 6.0–8.3)

## 2023-08-08 LAB — AMYLASE: Amylase: 116 U/L (ref 27–131)

## 2023-08-08 LAB — TSH: TSH: 2.69 u[IU]/mL (ref 0.35–5.50)

## 2023-08-08 MED ORDER — NA SULFATE-K SULFATE-MG SULF 17.5-3.13-1.6 GM/177ML PO SOLN: 1.0000 | Freq: Once | ORAL | 0 refills | Status: AC

## 2023-08-08 NOTE — Patient Instructions (Addendum)
Start taking Miralax 1 capful (17 grams) 1x / day for 1 week.   If this is not effective, increase to 1 dose 2x / day for 1 week.   If this is still not effective, increase to two capfuls (34 grams) 2x / day.   Can adjust dose as needed based on response. Can take 1/2 cap daily, skip days, or increase per day.     Your provider has requested that you go to the basement level for lab work before leaving today. Press "B" on the elevator. The lab is located at the first door on the left as you exit the elevator.  You have been scheduled for an MRI at [facility] on 08/16/2023. Your appointment time is 4:00pm . Please arrive to admitting (at main entrance of the hospital) 30 minutes prior to your appointment time for registration purposes. Please make certain not to have anything to eat or drink 4 hours prior to your test. In addition, if you have any metal in your body, have a pacemaker or defibrillator, please be sure to let your ordering physician know. This test typically takes 45 minutes to 1 hour to complete. Should you need to reschedule, please call [phone] to do so.   You have been scheduled for a colonoscopy. Please follow written instructions given to you at your visit today.   Please pick up your prep supplies at the pharmacy within the next 1-3 days.  If you use inhalers (even only as needed), please bring them with you on the day of your procedure.  DO NOT TAKE 7 DAYS PRIOR TO TEST- Trulicity (dulaglutide) Ozempic, Wegovy (semaglutide) Mounjaro (tirzepatide) Bydureon Bcise (exanatide extended release)  DO NOT TAKE 1 DAY PRIOR TO YOUR TEST Rybelsus (semaglutide) Adlyxin (lixisenatide) Victoza (liraglutide) Byetta (exanatide) ___________________________________________________________________________  _______________________________________________________  If your blood pressure at your visit was 140/90 or greater, please contact your primary care physician to follow up  on this.  _______________________________________________________  If you are age 72 or older, your body mass index should be between 23-30. Your Body mass index is 15.03 kg/m. If this is out of the aforementioned range listed, please consider follow up with your Primary Care Provider.  If you are age 68 or younger, your body mass index should be between 19-25. Your Body mass index is 15.03 kg/m. If this is out of the aformentioned range listed, please consider follow up with your Primary Care Provider.   ________________________________________________________  The [place] providers would like to encourage you to use [facility] to communicate with providers for non-urgent requests or questions.  Due to long hold times on the telephone, sending your provider a message by [facility] may be a faster and more efficient way to get a response.  Please allow 48 business hours for a response.  Please remember that this is for non-urgent requests.  _______________________________________________________ It was a pleasure to see you today!  Thank you for trusting me with your gastrointestinal care!

## 2023-08-08 NOTE — Progress Notes (Signed)
Chief Complaint: elevated amylase Primary GI MD: Gentry Fitz  HPI: 53 year old female with history as listed below to include sarcoidosis and thyroid disease presents for evaluation of elevated amylase  Patient presented to PCP 10/16/2024with a right lower abdominal pain ongoing for several days. Lab work 06/21/2023 - Normal CBC - CMP with alk phos of 129, otherwise normal - Lipase 14 - Amylase 111  Repeat amylase 07/12/2023 of 144  No previous colonoscopy.  Patient does have CT abdomen pelvis with contrast January 2020 done for RLQ pain which showed a stable 15 mm cyst in the left lobe of the liver and several small lesions too small to characterize.  Normal gallbladder.  Normal pancreas.  No pancreatic ductal dilation or surrounding inflammatory changes.  No visualization of the appendix.  Patient states she has had intermittent lower abdominal pain ongoing for the past few months.  States it does not happen all the time but is typically associated with a bowel movement.  States it comes on when she has a need to have a bowel movement and is alleviated after a bowel movement.  She states she has been struggling with constipation lately and can go 3 days without a bowel movement.  Denies melena/hematochezia.  Denies unintentional weight loss.  Denies nausea/vomiting.  Denies family history of colon cancer.  No previous colonoscopy.  She states she has never had a history of pancreatitis.  She does not think that she has high triglycerides.  No family history of pancreatitis.  She states she does drink a significant amount of alcohol.  She drinks daily whether its beer, liquor, or wine.  Typically she will have at least 2 mikes hard lemonade's per day.  Past Medical History:  Diagnosis Date   Migraines    Sarcoidosis    Thyroid disease     Past Surgical History:  Procedure Laterality Date   LUNG BIOPSY     4 years ago    Current Outpatient Medications  Medication Sig Dispense  Refill   acetaminophen (TYLENOL) 325 MG tablet Take 650 mg by mouth every 6 (six) hours as needed.     ondansetron (ZOFRAN ODT) 4 MG disintegrating tablet Take 1 tablet (4 mg total) by mouth every 8 (eight) hours as needed for nausea or vomiting. 20 tablet 0   valsartan (DIOVAN) 40 MG tablet Take 1 tablet (40 mg total) by mouth daily. 90 tablet 0   No current facility-administered medications for this visit.    Allergies as of 08/08/2023   (No Known Allergies)    Family History  Problem Relation Age of Onset   Diabetes Mother    Hypertension Mother    Diabetes Father    Hypertension Father     Social History   Socioeconomic History   Marital status: Married    Spouse name: Not on file   Number of children: Not on file   Years of education: Not on file   Highest education level: Not on file  Occupational History   Not on file  Tobacco Use   Smoking status: Every Day    Types: Cigarettes   Smokeless tobacco: Never  Vaping Use   Vaping status: Never Used  Substance and Sexual Activity   Alcohol use: Yes    Comment: occ   Drug use: Yes    Types: Marijuana    Comment: occ   Sexual activity: Not on file  Other Topics Concern   Not on file  Social History Narrative   Not  on file   Social Determinants of Health   Financial Resource Strain: Not on file  Food Insecurity: Not on file  Transportation Needs: Not on file  Physical Activity: Not on file  Stress: Not on file  Social Connections: Not on file  Intimate Partner Violence: Not on file    Review of Systems:    Constitutional: No weight loss, fever, chills, weakness or fatigue HEENT: Eyes: No change in vision               Ears, Nose, Throat:  No change in hearing or congestion Skin: No rash or itching Cardiovascular: No chest pain, chest pressure or palpitations   Respiratory: No SOB or cough Gastrointestinal: See HPI and otherwise negative Genitourinary: No dysuria or change in urinary  frequency Neurological: No headache, dizziness or syncope Musculoskeletal: No new muscle or joint pain Hematologic: No bleeding or bruising Psychiatric: No history of depression or anxiety    Physical Exam:  Vital signs: Ht 5' 7.5" (1.715 m)   Wt 97 lb 6 oz (44.2 kg)   LMP 09/07/2017 (Approximate)   BMI 15.03 kg/m   Constitutional: NAD, thin, alert and cooperative Head:  Normocephalic and atraumatic. Eyes:   PEERL, EOMI. No icterus. Conjunctiva pink. Respiratory: Respirations even and unlabored. Lungs clear to auscultation bilaterally.   No wheezes, crackles, or rhonchi.  Cardiovascular:  Regular rate and rhythm. No peripheral edema, cyanosis or pallor.  Gastrointestinal:  Soft, nondistended, nontender. No rebound or guarding. Normal bowel sounds. No appreciable masses or hepatomegaly. Rectal:  Not performed.  Msk:  Symmetrical without gross deformities. Without edema, no deformity or joint abnormality.  Neurologic:  Alert and  oriented x4;  grossly normal neurologically.  Skin:   Dry and intact without significant lesions or rashes. Psychiatric: Oriented to person, place and time. Demonstrates good judgement and reason without abnormal affect or behaviors.   RELEVANT LABS AND IMAGING: CBC    Component Value Date/Time   WBC 10.7 06/21/2023 1342   WBC 7.6 01/01/2020 1053   RBC 3.89 06/21/2023 1342   RBC 3.53 (L) 01/01/2020 1053   HGB 13.4 06/21/2023 1342   HCT 40.2 06/21/2023 1342   PLT 231 06/21/2023 1342   MCV 103 (H) 06/21/2023 1342   MCH 34.4 (H) 06/21/2023 1342   MCH 34.0 01/01/2020 1053   MCHC 33.3 06/21/2023 1342   MCHC 32.9 01/01/2020 1053   RDW 13.9 06/21/2023 1342   LYMPHSABS 1.7 01/01/2020 1053   MONOABS 0.8 01/01/2020 1053   EOSABS 0.1 01/01/2020 1053   BASOSABS 0.0 01/01/2020 1053    CMP     Component Value Date/Time   NA 139 07/12/2023 1353   K 4.1 07/12/2023 1353   CL 96 07/12/2023 1353   CO2 28 07/12/2023 1353   GLUCOSE 85 07/12/2023 1353    GLUCOSE 82 01/01/2020 1036   BUN 8 07/12/2023 1353   CREATININE 0.80 07/12/2023 1353   CALCIUM 9.5 07/12/2023 1353   PROT 8.2 06/21/2023 1342   ALBUMIN 4.7 06/21/2023 1342   AST 19 06/21/2023 1342   ALT 7 06/21/2023 1342   ALKPHOS 129 (H) 06/21/2023 1342   BILITOT 0.9 06/21/2023 1342   GFRNONAA >60 01/01/2020 1036   GFRAA >60 01/01/2020 1036     Assessment/Plan:   52 year old female history of heavy alcohol use presenting with elevated amylase and lower abdominal pain associated with constipation  Elevated amylase Amylase 144 (111 one month ago).  Normal lipase.  Alk phos of 129, otherwise normal LFTs.  Last CT abdomen pelvis in 2020 showed normal pancreas and gallbladder.  Her lower abdominal pain is likely not associated with her amylase.  Could be multiple reasons for elevated amylase.  - MRCP for further evaluation of pancreas and pancreatic ducts - Discussed importance of cessation of alcohol as it can cause pancreatitis and a multitude of other issues - Repeat amylase today  Elevated alkaline phosphatase Very slight elevation of alkaline phosphatase of 129, significant alcohol use history.  Last CT showed hepatic cyst without cirrhosis changes in the liver.  Otherwise normal LFTs. - Hepatic panel - GGT, 5-NT, alk phosphatase isoenzymes - Recommend cessation of alcohol use  Constipation, unspecified constipation type Lower abdominal pain Lower abdominal pain associated with constipation. Likely IBS.  Currently not on any laxative regimen. - MiraLAX 1 capful daily - Increase water, increase fiber, increase exercise - No previous screening for colon cancer, will schedule colonoscopy - Can use IBgard for pain as needed  Screening for colon cancer No previous colon cancer screening.  Denies family history of colon cancer. -Schedule colonoscopy - I thoroughly discussed the procedure with the patient (at bedside) to include nature of the procedure, alternatives, benefits, and  risks (including but not limited to bleeding, infection, perforation, anesthesia/cardiac pulmonary complications).  Patient verbalized understanding and gave verbal consent to proceed with procedure.   Lara Mulch [facility] 08/08/2023, 9:44 AM  Cc: Rema Fendt, NP

## 2023-08-11 NOTE — Progress Notes (Signed)
Attending Physician's Attestation   I have reviewed the chart.   I agree with the Advanced Practitioner's note, impression, and recommendations with any updates as below. Workup for potential hyperamylasemia can be considered but with patient not having overt lipase elevation and her abdominal pain, if MRI/MRCP is not excepted by insurance, then at least updated CT imaging to ensure the pancreas is okay would make sense.  This can be a CT abdomen/pelvis with IV and oral contrast.  This can be considered based on the follow-up amylase elevation as well as the workup and management of her abdominal pain which seems to be IBS related, but can ensure nothing else is a source or cause with cross-sectional imaging.   Corliss Parish, MD [facility] Advanced Endoscopy Office # [phone]

## 2023-08-15 LAB — NUCLEOTIDASE, 5', BLOOD: 5-Nucleotidase: 3 U/L (ref 0–10)

## 2023-08-16 ENCOUNTER — Ambulatory Visit (HOSPITAL_COMMUNITY): Payer: Medicaid Other

## 2023-09-07 ENCOUNTER — Encounter: Payer: Medicaid Other | Admitting: Obstetrics and Gynecology

## 2023-09-11 ENCOUNTER — Encounter: Payer: Medicaid Other | Admitting: Obstetrics & Gynecology

## 2023-09-12 ENCOUNTER — Ambulatory Visit (HOSPITAL_COMMUNITY): Admission: RE | Admit: 2023-09-12 | Payer: Medicaid Other | Source: Ambulatory Visit

## 2023-09-20 ENCOUNTER — Ambulatory Visit (HOSPITAL_COMMUNITY): Payer: Medicaid Other

## 2023-09-26 ENCOUNTER — Encounter: Payer: Medicaid Other | Admitting: Gastroenterology

## 2023-10-03 ENCOUNTER — Ambulatory Visit (HOSPITAL_COMMUNITY)
Admission: RE | Admit: 2023-10-03 | Discharge: 2023-10-03 | Disposition: A | Discharge status: Home / Self Care | Payer: Medicaid Other | Source: Ambulatory Visit | Attending: Gastroenterology | Admitting: Gastroenterology

## 2023-10-03 DIAGNOSIS — K851 Biliary acute pancreatitis without necrosis or infection: Secondary | ICD-10-CM | POA: Diagnosis present

## 2023-10-03 DIAGNOSIS — R748 Abnormal levels of other serum enzymes: Secondary | ICD-10-CM | POA: Diagnosis present

## 2023-10-03 MED ORDER — GADOBUTROL 1 MMOL/ML IV SOLN
5.0000 mL | Freq: Once | INTRAVENOUS | Status: AC | PRN
  Administered 2023-10-03: 5 mL via INTRAVENOUS

## 2023-10-04 ENCOUNTER — Other Ambulatory Visit: Payer: Self-pay | Admitting: Gastroenterology

## 2023-10-04 DIAGNOSIS — R748 Abnormal levels of other serum enzymes: Secondary | ICD-10-CM

## 2023-10-04 DIAGNOSIS — K851 Biliary acute pancreatitis without necrosis or infection: Secondary | ICD-10-CM

## 2023-10-09 ENCOUNTER — Other Ambulatory Visit: Payer: Self-pay | Admitting: *Deleted

## 2023-10-09 DIAGNOSIS — R933 Abnormal findings on diagnostic imaging of other parts of digestive tract: Secondary | ICD-10-CM

## 2023-10-09 NOTE — Progress Notes (Signed)
hHFE, iron studies with ferritin, repeat CBC, PT/INR and aPTT

## 2023-10-13 ENCOUNTER — Other Ambulatory Visit: Payer: Self-pay | Admitting: Family

## 2023-10-13 ENCOUNTER — Encounter: Payer: Medicaid Other | Admitting: Family

## 2023-10-13 DIAGNOSIS — I10 Essential (primary) hypertension: Secondary | ICD-10-CM

## 2023-10-13 MED ORDER — VALSARTAN 40 MG PO TABS: 40.0000 mg | ORAL_TABLET | Freq: Every day | ORAL | 0 refills | Status: DC

## 2023-10-13 NOTE — Progress Notes (Signed)
 Patient states no other concerns to discuss.

## 2023-10-13 NOTE — Progress Notes (Addendum)
 Patient ID: Kristine Baldwin, female    DOB: 1971/02/11  MRN: [id number]  CC: Chronic Conditions Follow-Up  Subjective: Kristine Baldwin is a 53 y.o. female who presents for chronic conditions follow-up.   Her concerns today include:  - Doing well on Valsartan , no issues/concerns. She does not complain of red flag symptoms such as but not limited to chest pain, shortness of breath, worst headache of life, nausea/vomiting.  - Established with Gastroenterology. - Plans to callback [facility] soon to schedule appointment. - No issues/concerns for discussion today.  Patient Active Problem List   Diagnosis Date Noted   BV (bacterial vaginosis) 06/23/2023     Current Outpatient Medications on File Prior to Visit  Medication Sig Dispense Refill   acetaminophen  (TYLENOL ) 325 MG tablet Take 650 mg by mouth every 6 (six) hours as needed. (Patient not taking: Reported on 10/13/2023)     ondansetron  (ZOFRAN  ODT) 4 MG disintegrating tablet Take 1 tablet (4 mg total) by mouth every 8 (eight) hours as needed for nausea or vomiting. (Patient not taking: Reported on 10/13/2023) 20 tablet 0   No current facility-administered medications on file prior to visit.    Allergies  Allergen Reactions   Prednisone  Other (See Comments)    Makes the patient have headaches     Social History   Socioeconomic History   Marital status: Divorced    Spouse name: Not on file   Number of children: Not on file   Years of education: Not on file   Highest education level: Not on file  Occupational History   Not on file  Tobacco Use   Smoking status: Every Day    Types: Cigarettes   Smokeless tobacco: Never  Vaping Use   Vaping status: Never Used  Substance and Sexual Activity   Alcohol use: Yes    Comment: occ   Drug use: Yes    Types: Marijuana    Comment: occ   Sexual activity: Not on file  Other Topics Concern   Not on file  Social History Narrative   Not on file   Social Drivers of  Health   Financial Resource Strain: Not on file  Food Insecurity: Not on file  Transportation Needs: Not on file  Physical Activity: Not on file  Stress: Not on file  Social Connections: Not on file  Intimate Partner Violence: Not on file    Family History  Problem Relation Age of Onset   Diabetes Mother    Hypertension Mother    Diabetes Father    Hypertension Father    Cancer Maternal Grandmother    Colon cancer Maternal Grandfather     Past Surgical History:  Procedure Laterality Date   LUNG BIOPSY     4 years ago    ROS: Review of Systems Negative except as stated above  PHYSICAL EXAM: BP 129/70   Pulse 88   Temp 98.6 F (37 C) (Oral)   Ht 5' 7 (1.702 m)   Wt 100 lb 6.4 oz (45.5 kg)   LMP 09/07/2017 (Approximate)   SpO2 96%   BMI 15.72 kg/m   Physical Exam HENT:     Head: Normocephalic and atraumatic.     Nose: Nose normal.     Mouth/Throat:     Mouth: Mucous membranes are moist.     Pharynx: Oropharynx is clear.  Eyes:     Extraocular Movements: Extraocular movements intact.     Conjunctiva/sclera: Conjunctivae normal.  Pupils: Pupils are equal, round, and reactive to light.  Cardiovascular:     Rate and Rhythm: Normal rate and regular rhythm.     Pulses: Normal pulses.     Heart sounds: Normal heart sounds.  Pulmonary:     Effort: Pulmonary effort is normal.     Breath sounds: Normal breath sounds.  Musculoskeletal:        General: Normal range of motion.     Cervical back: Normal range of motion and neck supple.  Neurological:     General: No focal deficit present.     Mental Status: She is alert and oriented to person, place, and time.  Psychiatric:        Mood and Affect: Mood normal.        Behavior: Behavior normal.     ASSESSMENT AND PLAN: Primary hypertension (Primary) - Continue Valsartan  as prescribed.  - Routine screening.  - Counseled on blood pressure goal of less than 130/80, low-sodium, DASH diet, medication  compliance, and 150 minutes of moderate intensity exercise per week as tolerated. Counseled on medication adherence and adverse effects. - Follow-up with primary provider in 3 months or sooner if needed.   Patient was given the opportunity to ask questions.  Patient verbalized understanding of the plan and was able to repeat key elements of the plan. Patient was given clear instructions to go to Emergency Department or return to medical center if symptoms don't improve, worsen, or new problems develop.The patient verbalized understanding.   Greig JINNY Drones, NP

## 2023-10-13 NOTE — Progress Notes (Signed)
 Erroneous encounter-disregard

## 2023-10-16 ENCOUNTER — Encounter: Payer: Self-pay | Admitting: Family

## 2023-10-16 LAB — BASIC METABOLIC PANEL
BUN/Creatinine Ratio: 16 (ref 9–23)
BUN: 11 mg/dL (ref 6–24)
CO2: 24 mmol/L (ref 20–29)
Calcium: 9.4 mg/dL (ref 8.7–10.2)
Chloride: 99 mmol/L (ref 96–106)
Creatinine, Ser: 0.7 mg/dL (ref 0.57–1.00)
Glucose: 91 mg/dL (ref 70–99)
Potassium: 3.7 mmol/L (ref 3.5–5.2)
Sodium: 139 mmol/L (ref 134–144)
eGFR: 103 mL/min/{1.73_m2} (ref >59)

## 2023-11-15 ENCOUNTER — Encounter: Payer: Medicaid Other | Admitting: Gastroenterology

## 2023-11-22 ENCOUNTER — Institutional Professional Consult (permissible substitution) (HOSPITAL_BASED_OUTPATIENT_CLINIC_OR_DEPARTMENT_OTHER): Payer: Medicaid Other | Admitting: Pulmonary Disease

## 2023-11-22 ENCOUNTER — Encounter (HOSPITAL_BASED_OUTPATIENT_CLINIC_OR_DEPARTMENT_OTHER): Payer: Self-pay | Admitting: Pulmonary Disease

## 2023-12-20 ENCOUNTER — Telehealth: Admitting: Physician Assistant

## 2023-12-20 DIAGNOSIS — N3000 Acute cystitis without hematuria: Secondary | ICD-10-CM | POA: Diagnosis not present

## 2023-12-20 MED ORDER — NITROFURANTOIN MONOHYD MACRO 100 MG PO CAPS: 100.0000 mg | ORAL_CAPSULE | Freq: Two times a day (BID) | ORAL | 0 refills | Status: AC

## 2023-12-20 NOTE — Progress Notes (Signed)
 Virtual Visit Consent   Kristine Baldwin, you are scheduled for a virtual visit with a [facility] provider today. Just as with appointments in the office, your consent must be obtained to participate. Your consent will be active for this visit and any virtual visit you may have with one of our providers in the next 365 days. If you have a MyChart account, a copy of this consent can be sent to you electronically.  As this is a virtual visit, video technology does not allow for your provider to perform a traditional examination. This may limit your provider's ability to fully assess your condition. If your provider identifies any concerns that need to be evaluated in person or the need to arrange testing (such as labs, EKG, etc.), we will make arrangements to do so. Although advances in technology are sophisticated, we cannot ensure that it will always work on either your end or our end. If the connection with a video visit is poor, the visit may have to be switched to a telephone visit. With either a video or telephone visit, we are not always able to ensure that we have a secure connection.  By engaging in this virtual visit, you consent to the provision of healthcare and authorize for your insurance to be billed (if applicable) for the services provided during this visit. Depending on your insurance coverage, you may receive a charge related to this service.  I need to obtain your verbal consent now. Are you willing to proceed with your visit today? Kristine Baldwin has provided verbal consent on 12/20/2023 for a virtual visit (video or telephone). Char Common Ward, PA-C  Date: 12/20/2023 5:59 PM   Virtual Visit via Video Note   I, Char Common Ward, connected with  Kristine Baldwin  ([id number], 1971-01-01) on 12/20/23 at  5:45 PM EDT by a video-enabled telemedicine application and verified that I am speaking with the correct person using two identifiers.  Location: Patient: Virtual Visit Location Patient:  Home Provider: Virtual Visit Location Provider: Home Office   I discussed the limitations of evaluation and management by telemedicine and the availability of in person appointments. The patient expressed understanding and agreed to proceed.    History of Present Illness: Kristine Baldwin is a 53 y.o. who identifies as a female who was assigned female at birth, and is being seen today for dysuria that started about 4 days ago.  Reports she took a home test for uti that was positive. She denies fever, chills, n/v, abdominal pain.  HPI: HPI  Problems:  Patient Active Problem List   Diagnosis Date Noted   BV (bacterial vaginosis) 06/23/2023    Allergies:  Allergies  Allergen Reactions   Prednisone Other (See Comments)    Makes the patient have headaches    Medications:  Current Outpatient Medications:    nitrofurantoin, macrocrystal-monohydrate, (MACROBID) 100 MG capsule, Take 1 capsule (100 mg total) by mouth 2 (two) times daily for 5 days., Disp: 10 capsule, Rfl: 0   acetaminophen (TYLENOL) 325 MG tablet, Take 650 mg by mouth every 6 (six) hours as needed. (Patient not taking: Reported on 10/13/2023), Disp: , Rfl:    ondansetron (ZOFRAN ODT) 4 MG disintegrating tablet, Take 1 tablet (4 mg total) by mouth every 8 (eight) hours as needed for nausea or vomiting. (Patient not taking: Reported on 10/13/2023), Disp: 20 tablet, Rfl: 0   valsartan (DIOVAN) 40 MG tablet, Take 1 tablet (40 mg total) by mouth daily., Disp: 90  tablet, Rfl: 0  Observations/Objective: Patient is well-developed, well-nourished in no acute distress.  Resting comfortably at home.  Head is normocephalic, atraumatic.  No labored breathing.  Speech is clear and coherent with logical content.  Patient is alert and oriented at baseline.    Assessment and Plan: 1. Acute cystitis without hematuria (Primary)  Antibiotic prescribed.  Supportive care discussed.   Follow Up Instructions: I discussed the assessment and  treatment plan with the patient. The patient was provided an opportunity to ask questions and all were answered. The patient agreed with the plan and demonstrated an understanding of the instructions.  A copy of instructions were sent to the patient via MyChart unless otherwise noted below.     The patient was advised to call back or seek an in-person evaluation if the symptoms worsen or if the condition fails to improve as anticipated.    Char Common Ward, PA-C

## 2023-12-25 ENCOUNTER — Telehealth: Payer: Self-pay | Admitting: Gastroenterology

## 2023-12-25 DIAGNOSIS — Z1211 Encounter for screening for malignant neoplasm of colon: Secondary | ICD-10-CM

## 2023-12-25 MED ORDER — NA SULFATE-K SULFATE-MG SULF 17.5-3.13-1.6 GM/177ML PO SOLN
1.0000 | Freq: Once | ORAL | 0 refills | Status: AC
Start: 2023-12-25 — End: 2023-12-25

## 2023-12-25 NOTE — Telephone Encounter (Signed)
 Inbound call from patient, states she has not received suprep medication, would like it sent to  [facility] on [address] In [place].

## 2023-12-25 NOTE — Telephone Encounter (Signed)
 Returned pt's call. Sent prescription for Suprep to the above mentioned pharmacy. Pt states she has updated instructions for tomorrow's procedure.

## 2023-12-26 ENCOUNTER — Encounter: Admitting: Gastroenterology

## 2023-12-26 ENCOUNTER — Telehealth: Payer: Self-pay | Admitting: Gastroenterology

## 2023-12-26 NOTE — Telephone Encounter (Signed)
 Since this is an issue from a pharmacy standpoint, then there will not be a late cancellation fee. I am glad that she is rescheduled. GM

## 2023-12-26 NOTE — Telephone Encounter (Signed)
 Good morning Dr. Brice Campi this patient called to inform us  that she has not be able to receive her prep medication due to it not being in stock at her pharmacy. Patient did stated that she would like to reschedule her procedure. Patient was reschedule for June the 10 th at 10:30 AM. Please advise.

## 2024-01-26 ENCOUNTER — Ambulatory Visit (AMBULATORY_SURGERY_CENTER): Admitting: *Deleted

## 2024-01-26 VITALS — Ht 67.0 in | Wt 98.0 lb

## 2024-01-26 DIAGNOSIS — Z1211 Encounter for screening for malignant neoplasm of colon: Secondary | ICD-10-CM

## 2024-01-26 MED ORDER — NA SULFATE-K SULFATE-MG SULF 17.5-3.13-1.6 GM/177ML PO SOLN: 1.0000 | Freq: Once | ORAL | 0 refills | Status: AC

## 2024-01-26 NOTE — Progress Notes (Signed)
 Pre visit completed over telephone. Instructions sent through MyChart.  No egg or soy allergy known to patient  No issues known to pt with past sedation with any surgeries or procedures Patient denies ever being told they had issues or difficulty with intubation  No FH of Malignant Hyperthermia Pt is not on diet pills Pt is not on  home 02  Pt is not on blood thinners  Pt denies issues with constipation  No A fib or A flutter Have any cardiac testing pending--NO Pt instructed to use [email or web address] or GoodRx for a price reduction on prep

## 2024-02-13 ENCOUNTER — Encounter: Payer: Self-pay | Admitting: Gastroenterology

## 2024-02-13 ENCOUNTER — Ambulatory Visit: Admitting: Gastroenterology

## 2024-02-13 VITALS — BP 170/92 | HR 84 | Temp 98.8°F | Resp 18 | Ht 67.0 in | Wt 98.0 lb

## 2024-02-13 DIAGNOSIS — D128 Benign neoplasm of rectum: Secondary | ICD-10-CM

## 2024-02-13 DIAGNOSIS — K5900 Constipation, unspecified: Secondary | ICD-10-CM | POA: Diagnosis not present

## 2024-02-13 DIAGNOSIS — K641 Second degree hemorrhoids: Secondary | ICD-10-CM

## 2024-02-13 DIAGNOSIS — Z1211 Encounter for screening for malignant neoplasm of colon: Secondary | ICD-10-CM

## 2024-02-13 DIAGNOSIS — D127 Benign neoplasm of rectosigmoid junction: Secondary | ICD-10-CM | POA: Diagnosis not present

## 2024-02-13 DIAGNOSIS — R109 Unspecified abdominal pain: Secondary | ICD-10-CM | POA: Diagnosis not present

## 2024-02-13 DIAGNOSIS — Q439 Congenital malformation of intestine, unspecified: Secondary | ICD-10-CM

## 2024-02-13 DIAGNOSIS — D125 Benign neoplasm of sigmoid colon: Secondary | ICD-10-CM | POA: Diagnosis not present

## 2024-02-13 DIAGNOSIS — I10 Essential (primary) hypertension: Secondary | ICD-10-CM | POA: Diagnosis not present

## 2024-02-13 MED ORDER — SODIUM CHLORIDE 0.9 % IV SOLN: 500.0000 mL | Freq: Once | INTRAVENOUS | Status: AC

## 2024-02-13 NOTE — Progress Notes (Signed)
 Report to PACU, RN, vss, BBS= Clear.

## 2024-02-13 NOTE — Op Note (Signed)
 [facility] Patient Name: Kristine Baldwin Procedure Date: 02/13/2024 11:07 AM MRN: [id number] Endoscopist: Yong Henle , MD, [id number] Age: 53 Referring MD:  Date of Birth: 02-15-1971 Gender: Female Account #: [id number] Procedure:                Colonoscopy Indications:              Screening for colorectal malignant neoplasm, This                            is the patient's first colonoscopy Medicines:                Monitored Anesthesia Care Procedure:                Pre-Anesthesia Assessment:                           - Prior to the procedure, a History and Physical                            was performed, and patient medications and                            allergies were reviewed. The patient's tolerance of                            previous anesthesia was also reviewed. The risks                            and benefits of the procedure and the sedation                            options and risks were discussed with the patient.                            All questions were answered, and informed consent                            was obtained. Prior Anticoagulants: The patient has                            taken no anticoagulant or antiplatelet agents. ASA                            Grade Assessment: II - A patient with mild systemic                            disease. After reviewing the risks and benefits,                            the patient was deemed in satisfactory condition to                            undergo the procedure.  After obtaining informed consent, the colonoscope                            was passed under direct vision. Throughout the                            procedure, the patient's blood pressure, pulse, and                            oxygen saturations were monitored continuously. The                            Olympus Scope SN [id number] was introduced through the                            anus and  advanced to the 3 cm into the ileum. The                            colonoscopy was performed without difficulty. The                            patient tolerated the procedure. The quality of the                            bowel preparation was good. The terminal ileum,                            ileocecal valve, appendiceal orifice, and rectum                            were photographed. Scope In: 11:16:01 AM Scope Out: 11:44:01 AM Scope Withdrawal Time: 0 hours 24 minutes 26 seconds  Total Procedure Duration: 0 hours 28 minutes 0 seconds  Findings:                 The digital rectal exam findings include                            hemorrhoids. Pertinent negatives include no                            palpable rectal lesions.                           The left colon was moderately tortuous.                           The terminal ileum and ileocecal valve appeared                            normal.                           A 26 mm polyp was found in the sigmoid colon. The  polyp was semi-pedunculated. Preparations were made                            for mucosal resection. Demarcation of the lesion                            was performed with high-definition white light and                            narrow band imaging to clearly identify the                            boundaries of the lesion. Saline was injected to                            raise the lesion. Snare mucosal resection was                            performed. Resection and retrieval were complete.                            Resected tissue margins were examined and clear of                            polyp tissue. To prevent bleeding after mucosal                            resection, one hemostatic clip was successfully                            placed. There was no bleeding during, or at the                            end, of the procedure.                           A 12 mm polyp was  found in the recto-sigmoid colon.                            The polyp was semi-sessile. The polyp was removed                            with a cold snare. Resection and retrieval were                            complete.                           Normal mucosa was found in the entire colon                            otherwise.  Non-bleeding non-thrombosed external and internal                            hemorrhoids were found during retroflexion, during                            perianal exam and during digital exam. The                            hemorrhoids were Grade II (internal hemorrhoids                            that prolapse but reduce spontaneously). Complications:            No immediate complications. Estimated Blood Loss:     Estimated blood loss was minimal. Impression:               - Hemorrhoids found on digital rectal exam.                           - Tortuous left colon.                           - The examined portion of the ileum was normal.                           - One 26 mm polyp in the sigmoid colon, removed                            with mucosal resection. Resected and retrieved.                            Clip was placed.                           - One 12 mm polyp at the recto-sigmoid colon,                            removed with a cold snare. Resected and retrieved.                           - Normal mucosa in the entire examined colon                            otherwise.                           - Non-bleeding non-thrombosed external and internal                            hemorrhoids. Recommendation:           - The patient will be observed post-procedure,                            until all discharge criteria are met.                           -  Discharge patient to home.                           - Patient has a contact number available for                            emergencies. The signs and symptoms of potential                             delayed complications were discussed with the                            patient. Return to normal activities tomorrow.                            Written discharge instructions were provided to the                            patient.                           - High fiber diet.                           - Use FiberCon 1-2 tablets PO daily.                           - Continue present medications.                           - Await pathology results.                           - Repeat colonoscopy in 3 years for surveillance,                            unless pathology shows/dictates other needs.                           - The findings and recommendations were discussed                            with the patient.                           - The findings and recommendations were discussed                            with the patient's family. Yong Henle, MD 02/13/2024 11:50:38 AM

## 2024-02-13 NOTE — Progress Notes (Signed)
 Pt's states no medical or surgical changes since previsit or office visit.

## 2024-02-13 NOTE — Progress Notes (Signed)
 1141 pt started gulping.  Sedation halted. HOB dropped to steep t-burg and suction immediatelly started.  Pt allowed to wake up in procedure room.  Sats never dropped and BS immediately clear.  Very small amount of clear fluid obtained in cannister

## 2024-02-13 NOTE — Patient Instructions (Signed)
 Please read handouts provided. Continue present medications. Await pathology results. Clip Card. High Fiber Diet. Use FiberCon 1-2 tablets daily.  YOU HAD AN ENDOSCOPIC PROCEDURE TODAY AT THE [facility]:   Refer to the procedure report that was given to you for any specific questions about what was found during the examination.  If the procedure report does not answer your questions, please call your gastroenterologist to clarify.  If you requested that your care partner not be given the details of your procedure findings, then the procedure report has been included in a sealed envelope for you to review at your convenience later.  YOU SHOULD EXPECT: Some feelings of bloating in the abdomen. Passage of more gas than usual.  Walking can help get rid of the air that was put into your GI tract during the procedure and reduce the bloating. If you had a lower endoscopy (such as a colonoscopy or flexible sigmoidoscopy) you may notice spotting of blood in your stool or on the toilet paper. If you underwent a bowel prep for your procedure, you may not have a normal bowel movement for a few days.  Please Note:  You might notice some irritation and congestion in your nose or some drainage.  This is from the oxygen used during your procedure.  There is no need for concern and it should clear up in a day or so.  SYMPTOMS TO REPORT IMMEDIATELY:  Following lower endoscopy (colonoscopy or flexible sigmoidoscopy):  Excessive amounts of blood in the stool  Significant tenderness or worsening of abdominal pains  Swelling of the abdomen that is new, acute  Fever of 100F or higher.  For urgent or emergent issues, a gastroenterologist can be reached at any hour by calling [phone]. Do not use MyChart messaging for urgent concerns.    DIET:  We do recommend a small meal at first, but then you may proceed to your regular diet.  Drink plenty of fluids but you should avoid alcoholic beverages  for 24 hours.  ACTIVITY:  You should plan to take it easy for the rest of today and you should NOT DRIVE or use heavy machinery until tomorrow (because of the sedation medicines used during the test).    FOLLOW UP: Our staff will call the number listed on your records the next business day following your procedure.  We will call around 7:15- 8:00 am to check on you and address any questions or concerns that you may have regarding the information given to you following your procedure. If we do not reach you, we will leave a message.     If any biopsies were taken you will be contacted by phone or by letter within the next 1-3 weeks.  Please call us  at [phone] if you have not heard about the biopsies in 3 weeks.    SIGNATURES/CONFIDENTIALITY: You and/or your care partner have signed paperwork which will be entered into your electronic medical record.  These signatures attest to the fact that that the information above on your After Visit Summary has been reviewed and is understood.  Full responsibility of the confidentiality of this discharge information lies with you and/or your care-partner.

## 2024-02-13 NOTE — Progress Notes (Signed)
 Called to room to assist during endoscopic procedure.  Patient ID and intended procedure confirmed with present staff. Received instructions for my participation in the procedure from the performing physician.

## 2024-02-13 NOTE — Progress Notes (Signed)
 GASTROENTEROLOGY PROCEDURE H&P NOTE   Primary Care Physician: Senaida Dama, NP  HPI: Kristine Baldwin is a 53 y.o. female who presents for Colonoscopy for screening.  Past Medical History:  Diagnosis Date   Hypertension    Migraines    Sarcoidosis    Past Surgical History:  Procedure Laterality Date   LUNG BIOPSY     4 years ago   TUBAL LIGATION     Current Outpatient Medications  Medication Sig Dispense Refill   acetaminophen  (TYLENOL ) 325 MG tablet Take 650 mg by mouth every 6 (six) hours as needed. (Patient not taking: Reported on 10/13/2023)     ondansetron  (ZOFRAN  ODT) 4 MG disintegrating tablet Take 1 tablet (4 mg total) by mouth every 8 (eight) hours as needed for nausea or vomiting. (Patient not taking: Reported on 10/13/2023) 20 tablet 0   valsartan  (DIOVAN ) 40 MG tablet Take 1 tablet (40 mg total) by mouth daily. 90 tablet 0   Current Facility-Administered Medications  Medication Dose Route Frequency Provider Last Rate Last Admin   0.9 %  sodium chloride infusion  500 mL Intravenous Once Mansouraty, Kerry Odonohue Jr., MD        Current Outpatient Medications:    acetaminophen  (TYLENOL ) 325 MG tablet, Take 650 mg by mouth every 6 (six) hours as needed. (Patient not taking: Reported on 10/13/2023), Disp: , Rfl:    ondansetron  (ZOFRAN  ODT) 4 MG disintegrating tablet, Take 1 tablet (4 mg total) by mouth every 8 (eight) hours as needed for nausea or vomiting. (Patient not taking: Reported on 10/13/2023), Disp: 20 tablet, Rfl: 0   valsartan  (DIOVAN ) 40 MG tablet, Take 1 tablet (40 mg total) by mouth daily., Disp: 90 tablet, Rfl: 0  Current Facility-Administered Medications:    0.9 %  sodium chloride infusion, 500 mL, Intravenous, Once, Mansouraty, Albino Alu., MD Allergies  Allergen Reactions   Prednisone  Other (See Comments)    Makes the patient have headaches    Family History  Problem Relation Age of Onset   Diabetes Mother    Hypertension Mother    Diabetes Father     Hypertension Father    Colon cancer Maternal Uncle    Cancer Maternal Grandmother    Colon cancer Maternal Grandfather    Social History   Socioeconomic History   Marital status: Divorced    Spouse name: Not on file   Number of children: Not on file   Years of education: Not on file   Highest education level: Not on file  Occupational History   Not on file  Tobacco Use   Smoking status: Every Day    Types: Cigarettes   Smokeless tobacco: Never  Vaping Use   Vaping status: Every Day   Substances: Nicotine, Mixture of cannabinoids  Substance and Sexual Activity   Alcohol use: Yes    Comment: occ   Drug use: Yes    Types: Marijuana    Comment: occ   Sexual activity: Not on file  Other Topics Concern   Not on file  Social History Narrative   Not on file   Social Drivers of Health   Financial Resource Strain: Not on file  Food Insecurity: Not on file  Transportation Needs: Not on file  Physical Activity: Not on file  Stress: Not on file  Social Connections: Not on file  Intimate Partner Violence: Not on file    Physical Exam: Today's Vitals   02/13/24 1049  BP: (!) 146/96  Pulse: 91  Temp:  98.8 F (37.1 C)  TempSrc: Skin  SpO2: 100%  Weight: 98 lb (44.5 kg)  Height: 5\' 7"  (1.702 m)   Body mass index is 15.35 kg/m. GEN: NAD EYE: Sclerae anicteric ENT: MMM CV: Non-tachycardic GI: Soft, NT/ND NEURO:  Alert & Oriented x 3  Lab Results: No results for input(s): "WBC", "HGB", "HCT", "PLT" in the last 72 hours. BMET No results for input(s): "NA", "K", "CL", "CO2", "GLUCOSE", "BUN", "CREATININE", "CALCIUM" in the last 72 hours. LFT No results for input(s): "PROT", "ALBUMIN", "AST", "ALT", "ALKPHOS", "BILITOT", "BILIDIR", "IBILI" in the last 72 hours. PT/INR No results for input(s): "LABPROT", "INR" in the last 72 hours.   Impression / Plan: This is a 53 y.o.female who presents for Colonoscopy for screening.  The risks and benefits of endoscopic  evaluation/treatment were discussed with the patient and/or family; these include but are not limited to the risk of perforation, infection, bleeding, missed lesions, lack of diagnosis, severe illness requiring hospitalization, as well as anesthesia and sedation related illnesses.  The patient's history has been reviewed, patient examined, no change in status, and deemed stable for procedure.  The patient and/or family is agreeable to proceed.    Yong Henle, MD [facility] Office # [phone]

## 2024-02-14 ENCOUNTER — Telehealth: Payer: Self-pay

## 2024-02-14 NOTE — Telephone Encounter (Signed)
  Follow up Call-     02/13/2024   10:50 AM  Call back number  Post procedure Call Back phone  # [phone]  Permission to leave phone message Yes     Patient questions:  Do you have a fever, pain , or abdominal swelling? No. Pain Score  0 *  Have you tolerated food without any problems? Yes.    Have you been able to return to your normal activities? Yes.    Do you have any questions about your discharge instructions: Diet   No. Medications  No. Follow up visit  No.  Do you have questions or concerns about your Care? No.  Actions: * If pain score is 4 or above: No action needed, pain <4.

## 2024-02-15 LAB — SURGICAL PATHOLOGY

## 2024-02-28 ENCOUNTER — Ambulatory Visit: Payer: Self-pay | Admitting: Gastroenterology

## 2024-03-12 DIAGNOSIS — I10 Essential (primary) hypertension: Secondary | ICD-10-CM | POA: Diagnosis not present

## 2024-03-12 DIAGNOSIS — R112 Nausea with vomiting, unspecified: Secondary | ICD-10-CM | POA: Diagnosis not present

## 2024-03-15 ENCOUNTER — Telehealth: Admitting: Physician Assistant

## 2024-03-15 DIAGNOSIS — B9689 Other specified bacterial agents as the cause of diseases classified elsewhere: Secondary | ICD-10-CM | POA: Diagnosis not present

## 2024-03-15 DIAGNOSIS — I10 Essential (primary) hypertension: Secondary | ICD-10-CM

## 2024-03-15 DIAGNOSIS — N760 Acute vaginitis: Secondary | ICD-10-CM | POA: Diagnosis not present

## 2024-03-15 DIAGNOSIS — B349 Viral infection, unspecified: Secondary | ICD-10-CM | POA: Diagnosis not present

## 2024-03-15 MED ORDER — FLUTICASONE PROPIONATE 50 MCG/ACT NA SUSP: 2.0000 | Freq: Every day | NASAL | 0 refills | Status: AC

## 2024-03-15 MED ORDER — LIDOCAINE VISCOUS HCL 2 % MT SOLN: OROMUCOSAL | 0 refills | Status: AC

## 2024-03-15 MED ORDER — NAPROXEN 500 MG PO TABS: 500.0000 mg | ORAL_TABLET | Freq: Two times a day (BID) | ORAL | 0 refills | Status: AC

## 2024-03-15 MED ORDER — METRONIDAZOLE 500 MG PO TABS: 500.0000 mg | ORAL_TABLET | Freq: Two times a day (BID) | ORAL | 0 refills | Status: AC

## 2024-03-15 MED ORDER — VALSARTAN 40 MG PO TABS: 40.0000 mg | ORAL_TABLET | Freq: Every day | ORAL | 0 refills | Status: AC

## 2024-03-15 NOTE — Progress Notes (Signed)
 Virtual Visit Consent   Amerie B Bankert, you are scheduled for a virtual visit with a [facility] provider today. Just as with appointments in the office, your consent must be obtained to participate. Your consent will be active for this visit and any virtual visit you may have with one of our providers in the next 365 days. If you have a MyChart account, a copy of this consent can be sent to you electronically.  As this is a virtual visit, video technology does not allow for your provider to perform a traditional examination. This may limit your provider's ability to fully assess your condition. If your provider identifies any concerns that need to be evaluated in person or the need to arrange testing (such as labs, EKG, etc.), we will make arrangements to do so. Although advances in technology are sophisticated, we cannot ensure that it will always work on either your end or our end. If the connection with a video visit is poor, the visit may have to be switched to a telephone visit. With either a video or telephone visit, we are not always able to ensure that we have a secure connection.  By engaging in this virtual visit, you consent to the provision of healthcare and authorize for your insurance to be billed (if applicable) for the services provided during this visit. Depending on your insurance coverage, you may receive a charge related to this service.  I need to obtain your verbal consent now. Are you willing to proceed with your visit today? Trini B Gsell has provided verbal consent on 03/15/2024 for a virtual visit (video or telephone). Delon CHRISTELLA Dickinson, PA-C  Date: 03/15/2024 8:59 AM   Virtual Visit via Video Note   I, Delon CHRISTELLA Dickinson, connected with  JACIA SICKMAN  ([id number], [birth year]) on 03/15/24 at  7:45 AM EDT by a video-enabled telemedicine application and verified that I am speaking with the correct person using two identifiers.  Location: Patient: Virtual Visit  Location Patient: Home Provider: Virtual Visit Location Provider: Home Office   I discussed the limitations of evaluation and management by telemedicine and the availability of in person appointments. The patient expressed understanding and agreed to proceed.    History of Present Illness: RANYIA WITTING is a 53 y.o. who identifies as a female who was assigned female at birth, and is being seen today for emesis and sore throat.  HPI: Emesis  This is a new problem. The current episode started in the past 7 days (Started on Sunday, went to hospital on Tuesday (no record in Epic); reported having a sore throat to them and that is the remaining symptom with a headache). The emesis has an appearance of stomach contents. The maximum temperature recorded prior to her arrival was 102 - 102.9 F (102 highest; stayed between 100-102 for 3 days). Associated symptoms include chills, coughing (mild), a fever and headaches. Pertinent negatives include no diarrhea, dizziness or myalgias. She has tried acetaminophen , bed rest and increased fluids (bc powder) for the symptoms. The treatment provided mild relief.     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   Essential hypertension 09/08/2017    Allergies:  Allergies  Allergen Reactions   Prednisone  Other (See Comments)    Makes the patient have headaches    Medications:  Current Outpatient Medications:    fluticasone  (FLONASE ) 50 MCG/ACT nasal spray,  Place 2 sprays into both nostrils daily., Disp: 16 g, Rfl: 0   lidocaine  (XYLOCAINE ) 2 % solution, Swallow 5mL every 4-6 hours as needed for sore throat, Disp: 100 mL, Rfl: 0   metroNIDAZOLE  (FLAGYL ) 500 MG tablet, Take 1 tablet (500 mg total) by mouth 2 (two) times daily for 7 days., Disp: 14 tablet, Rfl: 0   naproxen  (NAPROSYN ) 500 MG tablet, Take 1 tablet (500 mg total) by mouth 2 (two)  times daily with a meal., Disp: 30 tablet, Rfl: 0   acetaminophen  (TYLENOL ) 325 MG tablet, Take 650 mg by mouth every 6 (six) hours as needed. (Patient not taking: Reported on 10/13/2023), Disp: , Rfl:    ondansetron  (ZOFRAN  ODT) 4 MG disintegrating tablet, Take 1 tablet (4 mg total) by mouth every 8 (eight) hours as needed for nausea or vomiting. (Patient not taking: Reported on 10/13/2023), Disp: 20 tablet, Rfl: 0   valsartan  (DIOVAN ) 40 MG tablet, Take 1 tablet (40 mg total) by mouth daily., Disp: 30 tablet, Rfl: 0  Current Facility-Administered Medications:    0.9 %  sodium chloride  infusion, 500 mL, Intravenous, Once, Mansouraty, Aloha Raddle., MD  Observations/Objective: Patient is well-developed, well-nourished in no acute distress.  Resting comfortably at home.  Head is normocephalic, atraumatic.  No labored breathing.  Speech is clear and coherent with logical content.  Patient is alert and oriented at baseline.    Assessment and Plan: 1. Viral illness (Primary) - naproxen  (NAPROSYN ) 500 MG tablet; Take 1 tablet (500 mg total) by mouth 2 (two) times daily with a meal.  Dispense: 30 tablet; Refill: 0 - fluticasone  (FLONASE ) 50 MCG/ACT nasal spray; Place 2 sprays into both nostrils daily.  Dispense: 16 g; Refill: 0 - lidocaine  (XYLOCAINE ) 2 % solution; Swallow 5mL every 4-6 hours as needed for sore throat  Dispense: 100 mL; Refill: 0  2. Primary hypertension - valsartan  (DIOVAN ) 40 MG tablet; Take 1 tablet (40 mg total) by mouth daily.  Dispense: 30 tablet; Refill: 0  3. BV (bacterial vaginosis) - metroNIDAZOLE  (FLAGYL ) 500 MG tablet; Take 1 tablet (500 mg total) by mouth 2 (two) times daily for 7 days.  Dispense: 14 tablet; Refill: 0  - Suspect viral illness, potentially covid based off symptoms - Added Naproxen , Flonase , and Viscous Lidocaine  for symptom management - Can use other OTC symptomatic management of choice - Patient also ask for a refill of her Valsartan ; one time 30 day  refill until can follow up with PCP for future refills - As we were ending the call she mentions having a vaginal discharge with a fishy odor over the last 2 weeks that has been intermittent but worsening; Metronidazole  prescribed for possible BV - Push fluids - Rest as needed - Steam and humidifier can help - Seek in person evaluation if worsening  Follow Up Instructions: I discussed the assessment and treatment plan with the patient. The patient was provided an opportunity to ask questions and all were answered. The patient agreed with the plan and demonstrated an understanding of the instructions.  A copy of instructions were sent to the patient via MyChart unless otherwise noted below.    The patient was advised to call back or seek an in-person evaluation if the symptoms worsen or if the condition fails to improve as anticipated.    Delon CHRISTELLA Dickinson, PA-C

## 2024-03-15 NOTE — Patient Instructions (Signed)
 Marko KATHEE Senters, thank you for joining Delon CHRISTELLA Dickinson, PA-C for today's virtual visit.  While this provider is not your primary care provider (PCP), if your PCP is located in our provider database this encounter information will be shared with them immediately following your visit.   A [facility] MyChart account gives you access to today's visit and all your visits, tests, and labs performed at [facility]  click here if you don't have a [facility] MyChart account or go to [email or web address]  Consent: (Patient) Kristine Baldwin provided verbal consent for this virtual visit at the beginning of the encounter.  Current Medications:  Current Outpatient Medications:    fluticasone  (FLONASE ) 50 MCG/ACT nasal spray, Place 2 sprays into both nostrils daily., Disp: 16 g, Rfl: 0   lidocaine  (XYLOCAINE ) 2 % solution, Swallow 5mL every 4-6 hours as needed for sore throat, Disp: 100 mL, Rfl: 0   metroNIDAZOLE  (FLAGYL ) 500 MG tablet, Take 1 tablet (500 mg total) by mouth 2 (two) times daily for 7 days., Disp: 14 tablet, Rfl: 0   naproxen  (NAPROSYN ) 500 MG tablet, Take 1 tablet (500 mg total) by mouth 2 (two) times daily with a meal., Disp: 30 tablet, Rfl: 0   acetaminophen  (TYLENOL ) 325 MG tablet, Take 650 mg by mouth every 6 (six) hours as needed. (Patient not taking: Reported on 10/13/2023), Disp: , Rfl:    ondansetron  (ZOFRAN  ODT) 4 MG disintegrating tablet, Take 1 tablet (4 mg total) by mouth every 8 (eight) hours as needed for nausea or vomiting. (Patient not taking: Reported on 10/13/2023), Disp: 20 tablet, Rfl: 0   valsartan  (DIOVAN ) 40 MG tablet, Take 1 tablet (40 mg total) by mouth daily., Disp: 30 tablet, Rfl: 0  Current Facility-Administered Medications:    0.9 %  sodium chloride  infusion, 500 mL, Intravenous, Once, Mansouraty, Gabriel Jr., MD   Medications ordered in this encounter:  Meds ordered this encounter  Medications   naproxen  (NAPROSYN ) 500 MG tablet    Sig:  Take 1 tablet (500 mg total) by mouth 2 (two) times daily with a meal.    Dispense:  30 tablet    Refill:  0    Supervising Provider:   LAMPTEY, PHILIP O [[id number]]   fluticasone  (FLONASE ) 50 MCG/ACT nasal spray    Sig: Place 2 sprays into both nostrils daily.    Dispense:  16 g    Refill:  0    Supervising Provider:   LAMPTEY, PHILIP O [id number]   lidocaine  (XYLOCAINE ) 2 % solution    Sig: Swallow 5mL every 4-6 hours as needed for sore throat    Dispense:  100 mL    Refill:  0    Supervising Provider:   BLAISE ALEENE KIDD [[id number]]   valsartan  (DIOVAN ) 40 MG tablet    Sig: Take 1 tablet (40 mg total) by mouth daily.    Dispense:  30 tablet    Refill:  0    Supervising Provider:   LAMPTEY, PHILIP O [[id number]]   metroNIDAZOLE  (FLAGYL ) 500 MG tablet    Sig: Take 1 tablet (500 mg total) by mouth 2 (two) times daily for 7 days.    Dispense:  14 tablet    Refill:  0    Supervising Provider:   BLAISE ALEENE KIDD [[id number]]     *If you need refills on other medications prior to your next appointment, please contact your pharmacy*  Follow-Up: Call back or seek an in-person evaluation if the symptoms worsen or if  the condition fails to improve as anticipated.  [facility] [phone]  Other Instructions Viral Illness, Adult Viruses are tiny germs that can get into a person's body and cause illness. There are many different types of viruses. And they cause many types of illness. Viral illnesses can range from mild to severe. They can affect various parts of the body. Short-term conditions that are caused by a virus include colds and flu (influenza) and stomach viruses. Long-term conditions that are caused by a virus include herpes, shingles, and human immunodeficiency virus (HIV) infection. A few viruses have been linked to certain cancers. What are the causes? Many types of viruses can cause illness. Viruses get into cells in your body, multiply, and cause the infected cells  to work differently or die. When these cells die, they release more of the virus. When this happens, you get symptoms of the illness and the virus spreads to other cells. If the virus takes over how the cell works, it can cause the cell to divide and grow out of control. This happens when a virus causes cancer. Different viruses get into the body in different ways. You can get a virus by: Swallowing food or water  that has come in contact with the virus. Breathing in droplets that have been coughed or sneezed into the air by an infected person. Touching a surface that has the virus on it and then touching your eyes, nose, or mouth. Being bitten by an insect or animal that carries the virus. Having sexual contact with a person who is infected with the virus. Being exposed to blood or fluids that contain the virus, either through an open cut or during a transfusion. If a virus enters your body, your body's disease-fighting system (immune system) will try to fight the virus. You may be at higher risk for a viral illness if your immune system is weak. What are the signs or symptoms? Symptoms depend on the type of virus and the location of the cells that it gets into. Symptoms can include: For cold and flu viruses: Fever. Headache. Sore throat. Muscle aches. Stuffy nose (nasal congestion). Cough. For stomach (gastrointestinal) viruses: Fever. Pain in the abdomen. Nausea or vomiting. Diarrhea. For liver viruses (hepatitis): Loss of appetite. Feeling tired. Skin or the white parts of your eyes turning yellow (jaundice). For brain and spinal cord viruses: Fever. Headache. Stiff neck. Nausea and vomiting. Confusion or being sleepy. For skin viruses: Warts. Itching. Rash. For sexually transmitted viruses: Discharge. Swelling. Redness. Rash. How is this diagnosed? This condition may be diagnosed based on one or more of these: Your symptoms and medical history. A physical  exam. Tests, such as: Blood tests. Tests on a sample of mucus from your lungs (sputum sample). Tests on a poop (stool) sample. Tests on a swab of body fluids or a skin sore (lesion). How is this treated? Viruses can be hard to treat because they live within cells. Antibiotics do not treat viruses because these medicines do not get inside cells. Treatment for a viral illness may include: Resting and drinking a lot of fluids. Medicines to treat symptoms. These can include over-the-counter medicine for pain and fever, medicines for cough or congestion, and medicines for diarrhea. Antiviral medicines. These medicines are available only for certain types of viruses. Some viral illnesses can be prevented with vaccinations. A common example is the flu shot. Follow these instructions at home: Medicines Take over-the-counter and prescription medicines only as told by your health care  provider. If you were prescribed an antiviral medicine, take it as told by your provider. Do not stop taking the antiviral even if you start to feel better. Know when antibiotics are needed and when they are not needed. Antibiotics do not treat viruses. You may get an antibiotic if your provider thinks that you may have, or are at risk for, a bacterial infection and you have a viral infection. Do not ask for an antibiotic prescription if you have been diagnosed with a viral illness. Antibiotics will not make your illness go away faster. Taking antibiotics when they are not needed can lead to antibiotic resistance. When this develops, the medicine no longer works against the bacteria that it normally fights. General instructions Drink enough fluids to keep your pee (urine) pale yellow. Rest as much as possible. Return to your normal activities as told by your provider. Ask your provider what activities are safe for you. How is this prevented? To lower your risk of getting another viral illness: Wash your hands often with  soap and water  for at least 20 seconds. If soap and water  are not available, use hand sanitizer. Avoid touching your nose, eyes, and mouth, especially if you have not washed your hands recently. If anyone in your household has a viral infection, clean all household surfaces that may have been in contact with the virus. Use soap and hot water . You may also use a commercially prepared, bleach-containing solution. Stay away from people who are sick with symptoms of a viral infection. Do not share items such as toothbrushes and water  bottles with other people. Keep your vaccinations up to date. This includes getting a yearly flu shot. Eat a healthy diet and get plenty of rest. Contact a health care provider if: You have symptoms of a viral illness that do not go away. Your symptoms come back after going away. Your symptoms get worse. Get help right away if: You have trouble breathing. You have a severe headache or a stiff neck. You have severe vomiting or pain in your abdomen. These symptoms may be an emergency. Get help right away. Call 911. Do not wait to see if the symptoms will go away. Do not drive yourself to the hospital. This information is not intended to replace advice given to you by your health care provider. Make sure you discuss any questions you have with your health care provider. Document Revised: 09/07/2022 Document Reviewed: 06/22/2022 Elsevier Patient Education  2024 [employer]   If you have been instructed to have an in-person evaluation today at a local Urgent Care facility, please use the link below. It will take you to a list of all of our available [facility], including address, phone number and hours of operation. Please do not delay care.  [facility]  If you or a family member do not have a primary care provider, use the link below to schedule a visit and establish care. When you choose a [facility] primary care physician or advanced  practice provider, you gain a long-term partner in health. Find a Primary Care Provider  Learn more about [facility]'s in-office and virtual care options: [facility] - Get Care Now
